# Patient Record
Sex: Female | Born: 1965
Health system: Southern US, Community
[De-identification: ages and names within clinical notes are randomized; demographics above are authoritative.]

## PROBLEM LIST (undated history)

## (undated) DIAGNOSIS — C50412 Malignant neoplasm of upper-outer quadrant of left female breast: Principal | ICD-10-CM

## (undated) DIAGNOSIS — B009 Herpesviral infection, unspecified: Secondary | ICD-10-CM

## (undated) DIAGNOSIS — K589 Irritable bowel syndrome without diarrhea: Secondary | ICD-10-CM

## (undated) DIAGNOSIS — Z1379 Encounter for other screening for genetic and chromosomal anomalies: Principal | ICD-10-CM

## (undated) DIAGNOSIS — I10 Essential (primary) hypertension: Secondary | ICD-10-CM

## (undated) HISTORY — DX: Encounter for other screening for genetic and chromosomal anomalies: Z13.79

## (undated) HISTORY — DX: Malignant neoplasm of upper-outer quadrant of left female breast: C50.412

## (undated) HISTORY — DX: Essential (primary) hypertension: I10

## (undated) HISTORY — DX: Herpesviral infection, unspecified: B00.9

## (undated) HISTORY — DX: Irritable bowel syndrome, unspecified: K58.9

## (undated) HISTORY — PX: MASTECTOMY: SHX3

---
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
## 1998-12-17 ENCOUNTER — Inpatient Hospital Stay (HOSPITAL_COMMUNITY): Admission: AD | Admit: 1998-12-17 | Discharge: 1998-12-20 | Payer: Self-pay | Admitting: Obstetrics & Gynecology

## 2000-07-28 ENCOUNTER — Other Ambulatory Visit: Admission: RE | Admit: 2000-07-28 | Discharge: 2000-07-28 | Payer: Self-pay | Admitting: Obstetrics & Gynecology

## 2002-01-02 ENCOUNTER — Other Ambulatory Visit: Admission: RE | Admit: 2002-01-02 | Discharge: 2002-01-02 | Payer: Self-pay | Admitting: Obstetrics & Gynecology

## 2003-03-19 ENCOUNTER — Other Ambulatory Visit: Admission: RE | Admit: 2003-03-19 | Discharge: 2003-03-19 | Payer: Self-pay | Admitting: Obstetrics & Gynecology

## 2004-05-26 ENCOUNTER — Other Ambulatory Visit: Admission: RE | Admit: 2004-05-26 | Discharge: 2004-05-26 | Payer: Self-pay | Admitting: Obstetrics & Gynecology

## 2015-04-21 HISTORY — PX: REDUCTION MAMMAPLASTY: SUR839

## 2015-04-21 HISTORY — PX: MASTECTOMY: SHX3

## 2015-04-21 HISTORY — PX: BREAST BIOPSY: SHX20

## 2015-12-09 ENCOUNTER — Other Ambulatory Visit: Payer: Self-pay | Admitting: Obstetrics & Gynecology

## 2015-12-09 DIAGNOSIS — R928 Other abnormal and inconclusive findings on diagnostic imaging of breast: Secondary | ICD-10-CM

## 2015-12-16 ENCOUNTER — Other Ambulatory Visit: Payer: Self-pay | Admitting: Obstetrics & Gynecology

## 2015-12-16 ENCOUNTER — Ambulatory Visit
Admission: RE | Admit: 2015-12-16 | Discharge: 2015-12-16 | Disposition: A | Discharge status: Home / Self Care | Payer: Managed Care, Other (non HMO) | Source: Ambulatory Visit | Attending: Obstetrics & Gynecology | Admitting: Obstetrics & Gynecology

## 2015-12-16 DIAGNOSIS — R928 Other abnormal and inconclusive findings on diagnostic imaging of breast: Secondary | ICD-10-CM

## 2015-12-20 ENCOUNTER — Ambulatory Visit
Admission: RE | Admit: 2015-12-20 | Discharge: 2015-12-20 | Disposition: A | Discharge status: Home / Self Care | Payer: Managed Care, Other (non HMO) | Source: Ambulatory Visit | Attending: Obstetrics & Gynecology | Admitting: Obstetrics & Gynecology

## 2015-12-20 ENCOUNTER — Other Ambulatory Visit: Payer: Self-pay | Admitting: Obstetrics & Gynecology

## 2015-12-20 DIAGNOSIS — R928 Other abnormal and inconclusive findings on diagnostic imaging of breast: Secondary | ICD-10-CM

## 2015-12-25 ENCOUNTER — Encounter: Payer: Self-pay | Admitting: *Deleted

## 2015-12-25 ENCOUNTER — Telehealth: Payer: Self-pay | Admitting: *Deleted

## 2015-12-25 DIAGNOSIS — C50412 Malignant neoplasm of upper-outer quadrant of left female breast: Secondary | ICD-10-CM

## 2015-12-25 DIAGNOSIS — Z170 Estrogen receptor positive status [ER+]: Secondary | ICD-10-CM | POA: Insufficient documentation

## 2015-12-25 HISTORY — DX: Malignant neoplasm of upper-outer quadrant of left female breast: C50.412

## 2015-12-25 NOTE — Telephone Encounter (Signed)
Confirmed [facility] for 01/01/16 at 1215 .  Instructions and contact information given.

## 2016-01-01 ENCOUNTER — Encounter: Payer: Self-pay | Admitting: Oncology

## 2016-01-01 ENCOUNTER — Encounter: Payer: Self-pay | Admitting: Physical Therapy

## 2016-01-01 ENCOUNTER — Encounter: Payer: Self-pay | Admitting: *Deleted

## 2016-01-01 ENCOUNTER — Ambulatory Visit: Payer: Managed Care, Other (non HMO) | Attending: General Surgery | Admitting: Physical Therapy

## 2016-01-01 ENCOUNTER — Other Ambulatory Visit: Payer: Self-pay | Admitting: General Surgery

## 2016-01-01 ENCOUNTER — Other Ambulatory Visit (HOSPITAL_BASED_OUTPATIENT_CLINIC_OR_DEPARTMENT_OTHER): Payer: Managed Care, Other (non HMO)

## 2016-01-01 ENCOUNTER — Ambulatory Visit (HOSPITAL_BASED_OUTPATIENT_CLINIC_OR_DEPARTMENT_OTHER): Payer: Managed Care, Other (non HMO) | Admitting: Oncology

## 2016-01-01 ENCOUNTER — Encounter: Payer: Self-pay | Admitting: General Practice

## 2016-01-01 ENCOUNTER — Ambulatory Visit
Admission: RE | Admit: 2016-01-01 | Discharge: 2016-01-01 | Disposition: A | Discharge status: Home / Self Care | Payer: Managed Care, Other (non HMO) | Source: Ambulatory Visit | Attending: Radiation Oncology | Admitting: Radiation Oncology

## 2016-01-01 VITALS — BP 142/73 | HR 87 | Temp 98.3°F | Resp 20 | Ht 63.5 in | Wt 163.5 lb

## 2016-01-01 DIAGNOSIS — R293 Abnormal posture: Secondary | ICD-10-CM | POA: Insufficient documentation

## 2016-01-01 DIAGNOSIS — C50412 Malignant neoplasm of upper-outer quadrant of left female breast: Secondary | ICD-10-CM

## 2016-01-01 DIAGNOSIS — D0512 Intraductal carcinoma in situ of left breast: Secondary | ICD-10-CM

## 2016-01-01 DIAGNOSIS — Z170 Estrogen receptor positive status [ER+]: Secondary | ICD-10-CM | POA: Diagnosis not present

## 2016-01-01 DIAGNOSIS — C50812 Malignant neoplasm of overlapping sites of left female breast: Secondary | ICD-10-CM | POA: Diagnosis not present

## 2016-01-01 LAB — COMPREHENSIVE METABOLIC PANEL
ALT: 32 U/L (ref 0–55)
ANION GAP: 12 meq/L — AB (ref 3–11)
AST: 19 U/L (ref 5–34)
Albumin: 4 g/dL (ref 3.5–5.0)
Alkaline Phosphatase: 22 U/L — ABNORMAL LOW (ref 40–150)
BILIRUBIN TOTAL: 0.39 mg/dL (ref 0.20–1.20)
BUN: 18.1 mg/dL (ref 7.0–26.0)
CHLORIDE: 101 meq/L (ref 98–109)
CO2: 25 meq/L (ref 22–29)
Calcium: 9.5 mg/dL (ref 8.4–10.4)
Creatinine: 0.8 mg/dL (ref 0.6–1.1)
EGFR: 88 mL/min/{1.73_m2} — AB (ref >90)
GLUCOSE: 120 mg/dL (ref 70–140)
Potassium: 3.6 mEq/L (ref 3.5–5.1)
SODIUM: 139 meq/L (ref 136–145)
TOTAL PROTEIN: 7.6 g/dL (ref 6.4–8.3)

## 2016-01-01 LAB — CBC WITH DIFFERENTIAL/PLATELET
BASO%: 0.5 % (ref 0.0–2.0)
Basophils Absolute: 0 10*3/uL (ref 0.0–0.1)
EOS%: 1.9 % (ref 0.0–7.0)
Eosinophils Absolute: 0.1 10*3/uL (ref 0.0–0.5)
HCT: 44.6 % (ref 34.8–46.6)
HGB: 14.8 g/dL (ref 11.6–15.9)
LYMPH%: 35.5 % (ref 14.0–49.7)
MCH: 28.4 pg (ref 25.1–34.0)
MCHC: 33.2 g/dL (ref 31.5–36.0)
MCV: 85.6 fL (ref 79.5–101.0)
MONO#: 0.3 10*3/uL (ref 0.1–0.9)
MONO%: 5.4 % (ref 0.0–14.0)
NEUT%: 56.7 % (ref 38.4–76.8)
NEUTROS ABS: 3.3 10*3/uL (ref 1.5–6.5)
Platelets: 258 10*3/uL (ref 145–400)
RBC: 5.21 10*6/uL (ref 3.70–5.45)
RDW: 13.6 % (ref 11.2–14.5)
WBC: 5.9 10*3/uL (ref 3.9–10.3)
lymph#: 2.1 10*3/uL (ref 0.9–3.3)

## 2016-01-01 MED ORDER — TAMOXIFEN CITRATE 20 MG PO TABS: 20.0000 mg | ORAL_TABLET | Freq: Every day | ORAL | 12 refills | Status: DC

## 2016-01-01 NOTE — Progress Notes (Addendum)
Radiation Oncology         [phone] ________________________________  Name: Tammy Gray MRN: [id number]  Date: 01/01/2016  DOB: Dec 23, 1965  [id number] Tammy Mages, MD  Tammy Rea, MD     REFERRING PHYSICIAN: Vania Rea, MD   DIAGNOSIS: The encounter diagnosis was Breast cancer of upper-outer quadrant of left female breast ([place]).   HISTORY OF PRESENT ILLNESS: Tammy Gray is a 50 y.o. female seen in the multidisciplinary breast cancer clinic for a new left sided breast cancer. She was found to have calcifications on a screening mammogram in the left breast. Ultrasound revealed these calcifications spanned about 4.7 cm. No evidence of adenopathy was noted. She comes today for evaluation and recommendations of care for her cancer.  PREVIOUS RADIATION THERAPY: No   PAST MEDICAL HISTORY:  Past Medical History:  Diagnosis Date  . Breast cancer of upper-outer quadrant of left female breast ([place]) 12/25/2015  . Herpes 1987  . Hypertension   . IBS (irritable bowel syndrome)        PAST SURGICAL HISTORY: Past Surgical History:  Procedure Laterality Date  . CESAREAN SECTION       FAMILY HISTORY:  Family History  Problem Relation Age of Onset  . Prostate cancer Father   . Colon cancer Paternal Grandmother   . Colon cancer Paternal Grandfather      SOCIAL HISTORY:  reports that she has never smoked. She has never used smokeless tobacco. She reports that she does not drink alcohol or use drugs. The patient is divorced and lives in [name].    ALLERGIES: Review of patient's allergies indicates no known allergies.   MEDICATIONS:  No current outpatient prescriptions on file.   No current facility-administered medications for this encounter.      REVIEW OF SYSTEMS: On review of systems, the patient reports that she is doing well overall. She denies any chest pain, shortness of breath, cough, fevers, chills, night sweats, unintended weight changes. She denies any bowel  or bladder disturbances, and denies abdominal pain, nausea or vomiting. She denies any new musculoskeletal or joint aches or pains. A complete review of systems is obtained and is otherwise negative.     PHYSICAL EXAM:  Wt Readings from Last 3 Encounters:  01/01/16 163 lb 8 oz (74.2 kg)   Temp Readings from Last 3 Encounters:  01/01/16 98.3 F (36.8 C) (Oral)   BP Readings from Last 3 Encounters:  01/01/16 (!) 142/73   Pulse Readings from Last 3 Encounters:  01/01/16 87   Pain scale 0/10 In general this is a well appearing caucasian female in no acute distress. She's alert and oriented x4 and appropriate throughout the examination. Cardiopulmonary assessment is negative for acute distress and she exhibits normal effort. Breast exam is deferred.   ECOG = 0  0 - Asymptomatic (Fully active, able to carry on all predisease activities without restriction)  1 - Symptomatic but completely ambulatory (Restricted in physically strenuous activity but ambulatory and able to carry out work of a [name] sedentary nature. For example, [name] housework, office work)  2 - Symptomatic, <50% in bed during the day (Ambulatory and capable of all self care but unable to carry out any work activities. Up and about more than 50% of waking hours)  3 - Symptomatic, >50% in bed, but not bedbound (Capable of only limited self-care, confined to bed or chair 50% or more of waking hours)  4 - Bedbound (Completely disabled. Cannot carry on any self-care. Totally confined  to bed or chair)  5 - Death   Eustace Pen MM, Creech RH, Tormey DC, et al. [phone]). "Toxicity and response criteria of the [facility]". [place] Oncol. 5 (6): 649-55    LABORATORY DATA:  Lab Results  Component Value Date   WBC 5.9 01/01/2016   HGB 14.8 01/01/2016   HCT 44.6 01/01/2016   MCV 85.6 01/01/2016   PLT 258 01/01/2016   Lab Results  Component Value Date   NA 139 01/01/2016   K 3.6 01/01/2016   CO2  25 01/01/2016   Lab Results  Component Value Date   ALT 32 01/01/2016   AST 19 01/01/2016   ALKPHOS 22 (L) 01/01/2016   BILITOT 0.39 01/01/2016      RADIOGRAPHY: Mm Digital Diagnostic Unilat L  Result Date: 12/20/2015 CLINICAL DATA:  Post biopsy mammogram of the left breast for clip placement. EXAM: DIAGNOSTIC LEFT MAMMOGRAM POST STEREOTACTIC BIOPSY COMPARISON:  Previous exam(s). FINDINGS: Mammographic images were obtained following stereotactic guided biopsy of calcifications in the upper-outer quadrant and the lower outer quadrant of the left breast. The X shaped biopsy marking clip is appropriately positioned at the site of biopsy in the upper-outer left breast. The coil shaped biopsy marking clip is appropriately positioned at the site of biopsied calcifications in the lower outer left breast. IMPRESSION: 1. Appropriate positioning of the X shaped biopsy marking clip at the site of biopsy in the upper-outer left breast. 2. Appropriate positioning of the coil shaped biopsy marking clip at the site of biopsy in the lower outer left breast. Final Assessment: Post Procedure Mammograms for Marker Placement Electronically Signed   By: Ammie Ferrier M.D.   On: 12/20/2015 10:43   Mm Digital Diagnostic Unilat L  Result Date: 12/16/2015 CLINICAL DATA:  Patient was called back from screening mammogram for left breast calcifications. EXAM: DIGITAL DIAGNOSTIC LEFT MAMMOGRAM WITH CAD COMPARISON:  Previous exam(s). ACR Breast Density Category c: The breast tissue is heterogeneously dense, which may obscure small masses. FINDINGS: Magnification views of the left breast were performed. There is 3 mm of developing coarse heterogeneous calcifications in the lateral aspect of the left breast. 4 cm lateral to this are developing loosely grouped punctate calcifications spanning an area 4.7 cm. There is no suspicious mass. Mammographic images were processed with CAD. IMPRESSION: Indeterminate calcifications in the  left breast. RECOMMENDATION: Stereotactic biopsy of the grouped coarse heterogeneous calcifications as well as the loosely grouped punctate calcifications is recommended. Biopsies will be scheduled at the patient's convenience. I have discussed the findings and recommendations with the patient. Results were also provided in writing at the conclusion of the visit. If applicable, a reminder letter will be sent to the patient regarding the next appointment. BI-RADS CATEGORY  4: Suspicious abnormality - biopsy should be considered. Electronically Signed   By: Lillia Mountain M.D.   On: 12/16/2015 11:10   Mm Lt Breast Bx W Loc Dev 1st Lesion Image Bx Spec Stereo Guide  Addendum Date: 12/24/2015   ADDENDUM REPORT: 12/24/2015 13:18 ADDENDUM: Pathology revealed HIGH GRADE DUCTAL CARCINOMA IN SITU WITH CALCIFICATIONS of the upper outer quadrant of the Left breast. HIGH GRADE DUCTAL CARCINOMA IN SITU WITH CALCIFICATIONS AND NECROSIS of the lower outer quadrant of the Left breast. This was found to be concordant by Dr. Ammie Ferrier. Pathology results were discussed with the patient by telephone. The patient reported doing well after the biopsies with tenderness and bruising at the sites. Post biopsy instructions and care were  reviewed and questions were answered. The patient was encouraged to call [facility] for any additional concerns. The patient was referred to [facility] on January 01, 2016. Pathology results reported by Terie Purser, RN on 12/24/2015. Electronically Signed   By: Ammie Ferrier M.D.   On: 12/24/2015 13:18   Result Date: 12/24/2015 CLINICAL DATA:  50 year old female presenting for stereotactic biopsy of left breast calcifications at 2 sites. EXAM: LEFT BREAST STEREOTACTIC CORE NEEDLE BIOPSY COMPARISON:  Previous exams. FINDINGS: The patient and I discussed the procedure of stereotactic-guided  biopsy including benefits and alternatives. We discussed the high likelihood of a successful procedure. We discussed the risks of the procedure including infection, bleeding, tissue injury, clip migration, and inadequate sampling. Informed written consent was given. The usual time out protocol was performed immediately prior to the procedure. Using sterile technique and 1% Lidocaine as local anesthetic, under stereotactic guidance, a 9 gauge vacuum assisted device was used to perform core needle biopsy of calcifications in the upper-outer quadrant of the left breast using a lateral approach. Specimen radiograph was performed showing calcifications in at least 1 of the core samples. Specimens with calcifications are identified for pathology. At the end of this biopsy, an X shaped tissue marker clip was deployed into the biopsy cavity. Using sterile technique and 1% Lidocaine as local anesthetic, under stereotactic guidance, a 9 gauge vacuum assisted device was used to perform core needle biopsy of calcifications in the lower outer quadrant of the left breast using a lateral approach. Specimen radiograph was performed showing calcifications in the several core samples. Specimens with calcifications are identified for pathology. At the conclusion of the procedure, a coil shaped tissue marker clip was deployed into the biopsy cavity. Follow-up 2-view mammogram was performed and dictated separately. IMPRESSION: 1. Stereotactic-guided biopsy of calcifications in the upper-outer quadrant of the left breast. No apparent complications. 2. Stereotactic guided biopsy of calcifications in the lower outer quadrant of the left breast. No apparent complications. Electronically Signed: By: Ammie Ferrier M.D. On: 12/20/2015 10:35   Mm Lt Breast Bx W Loc Dev Ea Ad Lesion Img Bx Spec Stereo Guide  Addendum Date: 12/24/2015   ADDENDUM REPORT: 12/24/2015 13:18 ADDENDUM: Pathology revealed HIGH GRADE DUCTAL CARCINOMA IN SITU WITH  CALCIFICATIONS of the upper outer quadrant of the Left breast. HIGH GRADE DUCTAL CARCINOMA IN SITU WITH CALCIFICATIONS AND NECROSIS of the lower outer quadrant of the Left breast. This was found to be concordant by Dr. Ammie Ferrier. Pathology results were discussed with the patient by telephone. The patient reported doing well after the biopsies with tenderness and bruising at the sites. Post biopsy instructions and care were reviewed and questions were answered. The patient was encouraged to call [facility] for any additional concerns. The patient was referred to [facility] on January 01, 2016. Pathology results reported by Terie Purser, RN on 12/24/2015. Electronically Signed   By: Ammie Ferrier M.D.   On: 12/24/2015 13:18   Result Date: 12/24/2015 CLINICAL DATA:  50 year old female presenting for stereotactic biopsy of left breast calcifications at 2 sites. EXAM: LEFT BREAST STEREOTACTIC CORE NEEDLE BIOPSY COMPARISON:  Previous exams. FINDINGS: The patient and I discussed the procedure of stereotactic-guided biopsy including benefits and alternatives. We discussed the high likelihood of a successful procedure. We discussed the risks of the procedure including infection,  bleeding, tissue injury, clip migration, and inadequate sampling. Informed written consent was given. The usual time out protocol was performed immediately prior to the procedure. Using sterile technique and 1% Lidocaine as local anesthetic, under stereotactic guidance, a 9 gauge vacuum assisted device was used to perform core needle biopsy of calcifications in the upper-outer quadrant of the left breast using a lateral approach. Specimen radiograph was performed showing calcifications in at least 1 of the core samples. Specimens with calcifications are identified for pathology. At the end of this biopsy, an X shaped tissue marker  clip was deployed into the biopsy cavity. Using sterile technique and 1% Lidocaine as local anesthetic, under stereotactic guidance, a 9 gauge vacuum assisted device was used to perform core needle biopsy of calcifications in the lower outer quadrant of the left breast using a lateral approach. Specimen radiograph was performed showing calcifications in the several core samples. Specimens with calcifications are identified for pathology. At the conclusion of the procedure, a coil shaped tissue marker clip was deployed into the biopsy cavity. Follow-up 2-view mammogram was performed and dictated separately. IMPRESSION: 1. Stereotactic-guided biopsy of calcifications in the upper-outer quadrant of the left breast. No apparent complications. 2. Stereotactic guided biopsy of calcifications in the lower outer quadrant of the left breast. No apparent complications. Electronically Signed: By: Ammie Ferrier M.D. On: 12/20/2015 10:35       IMPRESSION/PLAN: 1. ER/PR positive high grade DCIS with necrosis of the left breast. Dr. Lisbeth Renshaw discusses the pathology findings and reviews the nature of in situ disease. The consensus from the breast conference include conservation surgery versus mastectomy with sentinel mapping. Surgery would be followed by antiestrogen therapy. Based on  The patient's interest, she will move forward with left mastectomy and sentinel node evaluation. Given this, there is a low likelihood that she would need radiotherapy for her cancer. Dr. Lisbeth Renshaw discusses the risks, benefits, short, and long term effects of treatment, and at the end of the conversation all her questions are answered. Following surgery, she will proceed with estrogen blockade with Dr. Jana Hakim. We would be happy to see her back in the future as needed.   The above documentation reflects my direct findings during this shared patient visit. Please see the separate note by Dr. Lisbeth Renshaw on this date for the remainder of the patient's  plan of care.    Carola Rhine, PAC

## 2016-01-01 NOTE — Progress Notes (Signed)
[facility]  [phone] [phone]     ID: Tammy Gray DOB: 1965/12/27  MR#: [id number]  [id number]  Patient Care Team: Vania Rea, MD as PCP - General (Obstetrics and Gynecology) Stark Klein, MD as Consulting Physician (General Surgery) Chauncey Cruel, MD as Consulting Physician (Oncology) Kyung Rudd, MD as Consulting Physician (Radiation Oncology) Danella Sensing, MD as Consulting Physician (Dermatology) OTHER MD:  CHIEF COMPLAINT: Ductal carcinoma in situ  CURRENT TREATMENT: Awaiting definitive surgery   BREAST CANCER HISTORY: Tammy Gray had routine screening mammography at [facility] 12/05/2015. There were calcifications in the left breast which were felt to warrant further evaluation. Accordingly on 12/16/2015 she underwent left diagnostic mammography at the [facility]. This showed the breast density to be category C. In the lateral aspect of the left breast there was an area of 3 mm of course calcifications and 4 cm lateral to this there is an area of developing punctate calcifications measuring 4.7 cm.  Biopsy of these 2 areas was performed 12/20/2015. Both showed high-grade ductal carcinoma in situ, both were estrogen receptor positive (80-90%) and progesterone receptor positive (5-90%), all these being with moderate staining intensity.  Her subsequent history is as detailed below  INTERVAL HISTORY: Tammy Gray was evaluated in the multidisciplinary breast cancer clinic 01/01/2016. Her case was also presented in the multidisciplinary breast cancer conference that same morning. At that time a preliminary plan was proposed: Likely mastectomy, with sentinel lymph node sampling to be considered, with adjuvant radiation is appropriate, and consideration of anti-estrogens at the completion of local  REVIEW OF SYSTEMS: There were no specific symptoms leading to the original mammogram, which was routinely scheduled. The patient denies  unusual headaches, visual changes, nausea, vomiting, stiff neck, dizziness, or gait imbalance. There has been no cough, phlegm production, or pleurisy, no chest pain or pressure, and no change in bowel or bladder habits. The patient denies fever, rash, bleeding, unexplained fatigue or unexplained weight loss. She exercises chiefly by walking and she walks between 45 minutes in our at least 5 days a week. A detailed review of systems was otherwise entirely negative.   PAST MEDICAL HISTORY: Past Medical History:  Diagnosis Date  . Breast cancer of upper-outer quadrant of left female breast ([place]) 12/25/2015  . Herpes 1987  . Hypertension   . IBS (irritable bowel syndrome)     PAST SURGICAL HISTORY: Past Surgical History:  Procedure Laterality Date  . CESAREAN SECTION      FAMILY HISTORY Family History  Problem Relation Age of Onset  . Prostate cancer Father   . Colon cancer Paternal Grandmother   . Colon cancer Paternal Grandfather   The patient's father is 31 as of September 2017. He has a history of prostate cancer diagnosed age 95. The patient's mother is also alive, also 36 as of September 2017. On the paternal side a grandfather had colon cancer and a grandmother also colon cancer. There is no history of breast or ovarian cancer in the family to the patient's knowledge.  GYNECOLOGIC HISTORY:  No LMP recorded. She still having regular periods, and was on oral contraceptives until the time of this diagnosis, when this was stopped.  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 She is divorced. At home are her 2 sons Tammy Gray, age 64, studying business at [employer], and Tammy Gray, 17, currently in high school    ADVANCED DIRECTIVES: Not in place. At the 01/01/2016 visit the patient was given the appropriate  documents to complete and notarize at her discretion    HEALTH MAINTENANCE: Social History  Substance Use Topics  . Smoking status: Never Smoker  .  Smokeless tobacco: Never Used  . Alcohol use No     Colonoscopy:Never  PAP: Up-to-date  Bone density: Never   No Known Allergies  No current outpatient prescriptions on file.   No current facility-administered medications for this visit.     OBJECTIVE: Middle-aged white woman who appears stated age 50:   01/01/16 1228  BP: (!) 142/73  Pulse: 87  Resp: 20  Temp: 98.3 F (36.8 C)     Body mass index is 28.51 kg/m.    ECOG FS:0 - Asymptomatic  Ocular: Sclerae unicteric, pupils equal, round and reactive to Tammy Gray: Oropharynx clear and moist Lymphatic: No cervical or supraclavicular adenopathy Lungs no rales or rhonchi, good excursion bilaterally Heart regular rate and rhythm, no murmur appreciated Abd soft, nontender, positive bowel sounds MSK no focal spinal tenderness, no joint edema Neuro: non-focal, well-oriented, appropriate affect Breasts: The right breast is unremarkable. The left breast is status post recent biopsy. There is no suspicious mass. There are no skin or nipple changes of concern. The left axilla is benign.   LAB RESULTS:  CMP     Component Value Date/Time   NA 139 01/01/2016 1213   K 3.6 01/01/2016 1213   CO2 25 01/01/2016 1213   GLUCOSE 120 01/01/2016 1213   BUN 18.1 01/01/2016 1213   CREATININE 0.8 01/01/2016 1213   CALCIUM 9.5 01/01/2016 1213   PROT 7.6 01/01/2016 1213   ALBUMIN 4.0 01/01/2016 1213   AST 19 01/01/2016 1213   ALT 32 01/01/2016 1213   ALKPHOS 22 (L) 01/01/2016 1213   BILITOT 0.39 01/01/2016 1213    INo results found for: SPEP, UPEP  Lab Results  Component Value Date   WBC 5.9 01/01/2016   NEUTROABS 3.3 01/01/2016   HGB 14.8 01/01/2016   HCT 44.6 01/01/2016   MCV 85.6 01/01/2016   PLT 258 01/01/2016      Chemistry      Component Value Date/Time   NA 139 01/01/2016 1213   K 3.6 01/01/2016 1213   CO2 25 01/01/2016 1213   BUN 18.1 01/01/2016 1213   CREATININE 0.8 01/01/2016 1213        Component Value Date/Time   CALCIUM 9.5 01/01/2016 1213   ALKPHOS 22 (L) 01/01/2016 1213   AST 19 01/01/2016 1213   ALT 32 01/01/2016 1213   BILITOT 0.39 01/01/2016 1213       No results found for: LABCA2  No components found for: LABCA125  No results for input(s): INR in the last 168 hours.  Urinalysis No results found for: COLORURINE, APPEARANCEUR, LABSPEC, PHURINE, GLUCOSEU, HGBUR, BILIRUBINUR, KETONESUR, PROTEINUR, UROBILINOGEN, NITRITE, LEUKOCYTESUR   STUDIES: Mm Digital Diagnostic Unilat L  Result Date: 12/20/2015 CLINICAL DATA:  Post biopsy mammogram of the left breast for clip placement. EXAM: DIAGNOSTIC LEFT MAMMOGRAM POST STEREOTACTIC BIOPSY COMPARISON:  Previous exam(s). FINDINGS: Mammographic images were obtained following stereotactic guided biopsy of calcifications in the upper-outer quadrant and the lower outer quadrant of the left breast. The X shaped biopsy marking clip is appropriately positioned at the site of biopsy in the upper-outer left breast. The coil shaped biopsy marking clip is appropriately positioned at the site of biopsied calcifications in the lower outer left breast. IMPRESSION: 1. Appropriate positioning of the X shaped biopsy marking clip at the site of biopsy in the upper-outer left breast.  2. Appropriate positioning of the coil shaped biopsy marking clip at the site of biopsy in the lower outer left breast. Final Assessment: Post Procedure Mammograms for Marker Placement Electronically Signed   By: Ammie Ferrier M.D.   On: 12/20/2015 10:43   Mm Digital Diagnostic Unilat L  Result Date: 12/16/2015 CLINICAL DATA:  Patient was called back from screening mammogram for left breast calcifications. EXAM: DIGITAL DIAGNOSTIC LEFT MAMMOGRAM WITH CAD COMPARISON:  Previous exam(s). ACR Breast Density Category c: The breast tissue is heterogeneously dense, which may obscure small masses. FINDINGS: Magnification views of the left breast were performed. There is  3 mm of developing coarse heterogeneous calcifications in the lateral aspect of the left breast. 4 cm lateral to this are developing loosely grouped punctate calcifications spanning an area 4.7 cm. There is no suspicious mass. Mammographic images were processed with CAD. IMPRESSION: Indeterminate calcifications in the left breast. RECOMMENDATION: Stereotactic biopsy of the grouped coarse heterogeneous calcifications as well as the loosely grouped punctate calcifications is recommended. Biopsies will be scheduled at the patient's convenience. I have discussed the findings and recommendations with the patient. Results were also provided in writing at the conclusion of the visit. If applicable, a reminder letter will be sent to the patient regarding the next appointment. BI-RADS CATEGORY  4: Suspicious abnormality - biopsy should be considered. Electronically Signed   By: Lillia Mountain M.D.   On: 12/16/2015 11:10   Mm Lt Breast Bx W Loc Dev 1st Lesion Image Bx Spec Stereo Guide  Addendum Date: 12/24/2015   ADDENDUM REPORT: 12/24/2015 13:18 ADDENDUM: Pathology revealed HIGH GRADE DUCTAL CARCINOMA IN SITU WITH CALCIFICATIONS of the upper outer quadrant of the Left breast. HIGH GRADE DUCTAL CARCINOMA IN SITU WITH CALCIFICATIONS AND NECROSIS of the lower outer quadrant of the Left breast. This was found to be concordant by Dr. Ammie Ferrier. Pathology results were discussed with the patient by telephone. The patient reported doing well after the biopsies with tenderness and bruising at the sites. Post biopsy instructions and care were reviewed and questions were answered. The patient was encouraged to call [facility] for any additional concerns. The patient was referred to [facility] at [facility] on January 01, 2016. Pathology results reported by Terie Purser, RN on 12/24/2015. Electronically Signed   By: Ammie Ferrier M.D.    On: 12/24/2015 13:18   Result Date: 12/24/2015 CLINICAL DATA:  50 year old female presenting for stereotactic biopsy of left breast calcifications at 2 sites. EXAM: LEFT BREAST STEREOTACTIC CORE NEEDLE BIOPSY COMPARISON:  Previous exams. FINDINGS: The patient and I discussed the procedure of stereotactic-guided biopsy including benefits and alternatives. We discussed the high likelihood of a successful procedure. We discussed the risks of the procedure including infection, bleeding, tissue injury, clip migration, and inadequate sampling. Informed written consent was given. The usual time out protocol was performed immediately prior to the procedure. Using sterile technique and 1% Lidocaine as local anesthetic, under stereotactic guidance, a 9 gauge vacuum assisted device was used to perform core needle biopsy of calcifications in the upper-outer quadrant of the left breast using a lateral approach. Specimen radiograph was performed showing calcifications in at least 1 of the core samples. Specimens with calcifications are identified for pathology. At the end of this biopsy, an X shaped tissue marker clip was deployed into the biopsy cavity. Using sterile technique and 1% Lidocaine as local anesthetic, under stereotactic guidance, a 9 gauge vacuum  assisted device was used to perform core needle biopsy of calcifications in the lower outer quadrant of the left breast using a lateral approach. Specimen radiograph was performed showing calcifications in the several core samples. Specimens with calcifications are identified for pathology. At the conclusion of the procedure, a coil shaped tissue marker clip was deployed into the biopsy cavity. Follow-up 2-view mammogram was performed and dictated separately. IMPRESSION: 1. Stereotactic-guided biopsy of calcifications in the upper-outer quadrant of the left breast. No apparent complications. 2. Stereotactic guided biopsy of calcifications in the lower outer quadrant of  the left breast. No apparent complications. Electronically Signed: By: Ammie Ferrier M.D. On: 12/20/2015 10:35   Mm Lt Breast Bx W Loc Dev Ea Ad Lesion Img Bx Spec Stereo Guide  Addendum Date: 12/24/2015   ADDENDUM REPORT: 12/24/2015 13:18 ADDENDUM: Pathology revealed HIGH GRADE DUCTAL CARCINOMA IN SITU WITH CALCIFICATIONS of the upper outer quadrant of the Left breast. HIGH GRADE DUCTAL CARCINOMA IN SITU WITH CALCIFICATIONS AND NECROSIS of the lower outer quadrant of the Left breast. This was found to be concordant by Dr. Ammie Ferrier. Pathology results were discussed with the patient by telephone. The patient reported doing well after the biopsies with tenderness and bruising at the sites. Post biopsy instructions and care were reviewed and questions were answered. The patient was encouraged to call [facility] for any additional concerns. The patient was referred to [facility] at [facility] on January 01, 2016. Pathology results reported by Terie Purser, RN on 12/24/2015. Electronically Signed   By: Ammie Ferrier M.D.   On: 12/24/2015 13:18   Result Date: 12/24/2015 CLINICAL DATA:  50 year old female presenting for stereotactic biopsy of left breast calcifications at 2 sites. EXAM: LEFT BREAST STEREOTACTIC CORE NEEDLE BIOPSY COMPARISON:  Previous exams. FINDINGS: The patient and I discussed the procedure of stereotactic-guided biopsy including benefits and alternatives. We discussed the high likelihood of a successful procedure. We discussed the risks of the procedure including infection, bleeding, tissue injury, clip migration, and inadequate sampling. Informed written consent was given. The usual time out protocol was performed immediately prior to the procedure. Using sterile technique and 1% Lidocaine as local anesthetic, under stereotactic guidance, a 9 gauge vacuum assisted device was used to perform  core needle biopsy of calcifications in the upper-outer quadrant of the left breast using a lateral approach. Specimen radiograph was performed showing calcifications in at least 1 of the core samples. Specimens with calcifications are identified for pathology. At the end of this biopsy, an X shaped tissue marker clip was deployed into the biopsy cavity. Using sterile technique and 1% Lidocaine as local anesthetic, under stereotactic guidance, a 9 gauge vacuum assisted device was used to perform core needle biopsy of calcifications in the lower outer quadrant of the left breast using a lateral approach. Specimen radiograph was performed showing calcifications in the several core samples. Specimens with calcifications are identified for pathology. At the conclusion of the procedure, a coil shaped tissue marker clip was deployed into the biopsy cavity. Follow-up 2-view mammogram was performed and dictated separately. IMPRESSION: 1. Stereotactic-guided biopsy of calcifications in the upper-outer quadrant of the left breast. No apparent complications. 2. Stereotactic guided biopsy of calcifications in the lower outer quadrant of the left breast. No apparent complications. Electronically Signed: By: Ammie Ferrier M.D. On: 12/20/2015 10:35    ELIGIBLE FOR AVAILABLE RESEARCH PROTOCOL: no  ASSESSMENT: 50 y.o. [facility] woman,  status post left breast biopsy 12/20/2015 for ductal carcinoma in situ, grade 3, estrogen and progesterone receptor positive.  (1) genetics testing pending  (2) definitive surgery pending  (3) adjuvant radiation to follow as appropriate  (4) tamoxifen started 01/01/2016   PLAN: We spent the better part of today's hour-long appointment discussing the biology of breast cancer in general, and the specifics of the patient's tumor in particular. Zhoey understands that in noninvasive ductal carcinoma, also called ductal carcinoma in situ ("DCIS") the breast cancer cells  remain trapped in the ducts were they started. They cannot travel to a vital organ. For that reason these cancers in themselves are not life-threatening.  If the whole breast is removed then all the ducts are removed and since the cancer cells are trapped in the ducts, the cure rate with mastectomy for noninvasive breast cancer is approximately 99%. Nevertheless we recommend lumpectomy, because there is no survival advantage to mastectomy and because the cosmetic result is generally superior with breast conservation.  If the patient is keeping her breasts, there will be some risk of recurrence. The recurrence can only be in the same breast since, again, the cells are trapped in the ducts. There is no connection from one breast to the other. The risk of local recurrence is cut by more than half with radiation, which is standard in this situation.  In estrogen receptor positive cancers like Neya's, anti-estrogens can also be considered. They will further reduce the risk of recurrence . In addition anti-estrogens will lower the risk of a new breast cancer developing in either breast,  by one half. That risk approaches 1% per year. Anti-estrogens reduce it to 1/2%.  Since Zenja is considering mastectomy and reconstruction, and the planning for this may be protracted, without it might be useful for her to be started on tamoxifen now. This means she is already being systemically treated, and she can take as long as she would like or need to make the best plans for her without any feeling of urgency.  It will also allow her to see how she does with tamoxifen. She tolerates it well and the plan will be to continue this for 5-10 years. If she does not tolerated well we will have to consider alternatives when she returns to see.  Joyann has a good understanding of the overall plan. She agrees with it. She knows the goal of treatment in her case is cure. She will call with any problems that may develop before her  next visit here.  Chauncey Cruel, MD   01/01/2016 1:51 PM Medical Oncology and Hematology [facility] [address] [place], [place] [address] Tel. [phone]    Fax. [phone]

## 2016-01-01 NOTE — Therapy (Signed)
[place], [place], [address] Phone: [phone]   Fax:  [phone]  Physical Therapy Evaluation  Patient Details  Name: Tammy Gray MRN: [id number] Date of Birth: March 08, 1966 Referring Provider: Dr. Stark Klein  Encounter Date: 01/01/2016      PT End of Session - 01/01/16 1505    Visit Number 1   Number of Visits 1   PT Start Time 1420   PT Stop Time 1445   PT Time Calculation (min) 25 min   Activity Tolerance Patient tolerated treatment well   Behavior During Therapy [facility] for tasks assessed/performed      Past Medical History:  Diagnosis Date  . Breast cancer of upper-outer quadrant of left female breast ([place]) 12/25/2015  . Herpes 1987  . Hypertension   . IBS (irritable bowel syndrome)     Past Surgical History:  Procedure Laterality Date  . CESAREAN SECTION      There were no vitals filed for this visit.       Subjective Assessment - 01/01/16 1458    Subjective Patient reports she is here today to meet with her medical team for her newly diagnosed left breast cancer.   Pertinent History Patient was diagnosed on 12/03/15 with left high grade DCIS breast cancer.  It is located in the lower outer and upper outer quadrants and measure 4.7 cm, and is ER/PR positive.   Patient Stated Goals Reduce lymphedema risk and learn post op shoulder ROM HEP   Currently in Pain? No/denies   Multiple Pain Sites No            [facility] PT Assessment - 01/01/16 0001      Assessment   Medical Diagnosis Left breast cancer   Referring Provider Dr. Stark Klein   Onset Date/Surgical Date 12/03/15   Hand Dominance Right   Prior Therapy none     Precautions   Precautions Other (comment)   Precaution Comments active cancer     Restrictions   Weight Bearing Restrictions No     Balance Screen   Has the patient fallen in the past 6 months No   Has the patient had a decrease in activity level because  of a fear of falling?  No   Is the patient reluctant to leave their home because of a fear of falling?  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   Leisure She walks 45 minutes 5x/week     Cognition   Overall Cognitive Status Within Functional Limits for tasks assessed     Posture/Postural Control   Posture/Postural Control Postural limitations   Postural Limitations Rounded Shoulders;Forward head     ROM / Strength   AROM / PROM / Strength AROM;Strength     AROM   AROM Assessment Site Shoulder;Cervical   Right/Left Shoulder Right;Left   Right Shoulder Extension 58 Degrees   Right Shoulder Flexion 154 Degrees   Right Shoulder ABduction 158 Degrees   Right Shoulder Internal Rotation 77 Degrees   Right Shoulder External Rotation 76 Degrees   Left Shoulder Extension 62 Degrees   Left Shoulder Flexion 148 Degrees   Left Shoulder ABduction 172 Degrees   Left Shoulder Internal Rotation 70  Degrees   Left Shoulder External Rotation 80 Degrees   Cervical Flexion WNL   Cervical Extension WNL   Cervical - Right Side Bend WNL   Cervical - Left Side Bend WNL   Cervical - Right Rotation WNL   Cervical - Left Rotation WNL     Strength   Overall Strength Within functional limits for tasks performed           LYMPHEDEMA/ONCOLOGY QUESTIONNAIRE - 01/01/16 1503      Type   Cancer Type Left breast cancer     Lymphedema Assessments   Lymphedema Assessments Upper extremities     Right Upper Extremity Lymphedema   10 cm Proximal to Olecranon Process 29.4 cm   Olecranon Process 26.4 cm   10 cm Proximal to Ulnar Styloid Process 22.4 cm   Just Proximal to Ulnar Styloid Process 15.3 cm   Across Hand at [employer] 18.8 cm   At [place] of  2nd Digit 6.3 cm     Left Upper Extremity Lymphedema   10 cm Proximal to Olecranon Process 29.7 cm   Olecranon Process 26.2 cm   15 cm Proximal to Ulnar Styloid Process 22.2 cm   10 cm Proximal to Ulnar Styloid Process 15.2 cm   Just Proximal to Ulnar Styloid Process 17.8 cm   Across Hand at [employer] 6 cm      Patient was instructed today in a home exercise program today for post op shoulder range of motion. These included active assist shoulder flexion in sitting, scapular retraction, wall walking with shoulder abduction, and hands behind head external rotation.  She was encouraged to do these twice a day, holding 3 seconds and repeating 5 times when permitted by her physician.         PT Education - 01/01/16 1504    Education provided Yes   Education Details Lymphedema risk reduction and post op shoulder ROM HEP   Person(s) Educated Patient   Methods Explanation;Demonstration;Handout   Comprehension Returned demonstration;Verbalized understanding              Breast Clinic Goals - 01/01/16 1542      Patient will be able to verbalize understanding of pertinent lymphedema risk reduction practices relevant to her diagnosis specifically related to skin care.   Time 1   Period Days   Status Achieved     Patient will be able to return demonstrate and/or verbalize understanding of the post-op home exercise program related to regaining shoulder range of motion.   Time 1   Period Days   Status Achieved     Patient will be able to verbalize understanding of the importance of attending the postoperative After Breast Cancer Class for further lymphedema risk reduction education and therapeutic exercise.   Time 1   Period Days   Status Achieved              Plan - 01/01/16 1505    Clinical Impression Statement Patient was diagnosed on 12/03/15 with left high grade DCIS breast cancer.  It is located in the lower outer and upper outer quadrants and measure 4.7 cm,  and is ER/PR positive.  Her multidisciplinary medical team met prior to her assessments to determine a recommended treatment plan.  She is planning to have a left mastectomy and sentinel node biopsy followed by anti-estrogen therapy.  She may benefit from post op PT to regain shoulder range of motion and reduce lymphedema risk.  Due to her alck of  comorbidities, her eval is of low complexity.   Rehab Potential Excellent   Clinical Impairments Affecting Rehab Potential none   PT Frequency One time visit   PT Treatment/Interventions Patient/family education;Therapeutic exercise   PT Next Visit Plan Will f/u after surgery to determine PT needs   PT Home Exercise Plan Post op shoulder ROM HEP   Consulted and Agree with Plan of Care Patient      Patient will benefit from skilled therapeutic intervention in order to improve the following deficits and impairments:  Postural dysfunction, Decreased knowledge of precautions, Pain, Impaired UE functional use, Decreased range of motion  Visit Diagnosis: Cancer of overlapping sites of left breast (HCC)  Abnormal posture   Patient will follow up at outpatient cancer rehab if needed following surgery.  If the patient requires physical therapy at that time, a specific plan will be dictated and sent to the referring physician for approval. The patient was educated today on appropriate basic range of motion exercises to begin post operatively and the importance of attending the After Breast Cancer class following surgery.  Patient was educated today on lymphedema risk reduction practices as it pertains to recommendations that will benefit the patient immediately following surgery.  She verbalized good understanding.  No additional physical therapy is indicated at this time.      Problem List Patient Active Problem List   Diagnosis Date Noted  . Breast cancer of upper-outer quadrant of left female breast ([place]) 12/25/2015    Annia Friendly, PT 01/01/16  3:43 PM  [place], [place], [address] Phone: [phone]   Fax:  [phone]  Name: Tammy Gray MRN: [id number] Date of Birth: [birth year]

## 2016-01-01 NOTE — Patient Instructions (Signed)

## 2016-01-02 NOTE — Progress Notes (Signed)
[place] Psychosocial Distress Screening Spiritual Care  Met with Tammy Gray in [facility] to introduce [facility] team/resources, reviewing distress screen per protocol.  The patient scored a 8 on the Psychosocial Distress Thermometer which indicates severe distress. Also assessed for distress and other psychosocial needs.   ONCBCN DISTRESS SCREENING 01/02/2016  Screening Type Initial Screening  Distress experienced in past week (1-10) 8  Practical problem type Work/school  Emotional problem type Nervousness/Anxiety;Adjusting to illness  Referral to support programs Yes   Per pt, her two biggest stressors are navigating work (very stressful in the last several months) and supporting her sons (16 and 36) while coping with her own distress.  Provided pastoral reflection, empathic listening, and normalization of feelings.  We talked in detail about supporting adolescents/young adults and resources that are available to support her/them, including guidance counselors/university counseling center and [facility].  Per pt, she found the conversation very helpful and reassuring.    Follow up needed: Yes.  Plan to check on pt in infusion to offer further support and encouragement.  Please also page if needs arise/circumstances change.  Thank you.   [place], [place], [facility] Pager [phone] Voicemail [phone]

## 2016-01-06 ENCOUNTER — Telehealth: Payer: Self-pay | Admitting: *Deleted

## 2016-01-06 NOTE — Telephone Encounter (Signed)
Spoke to pt concerning [place] from 12/25/15. Denies questions or concerns regarding dx or treatment care plan. Encourage pt to call with needs. Received verbal understanding. Contact information provided.

## 2016-01-07 ENCOUNTER — Encounter: Payer: Self-pay | Admitting: *Deleted

## 2016-01-07 ENCOUNTER — Ambulatory Visit (HOSPITAL_BASED_OUTPATIENT_CLINIC_OR_DEPARTMENT_OTHER): Payer: Managed Care, Other (non HMO) | Admitting: Genetic Counselor

## 2016-01-07 ENCOUNTER — Other Ambulatory Visit: Payer: Managed Care, Other (non HMO)

## 2016-01-07 DIAGNOSIS — Z315 Encounter for genetic counseling: Secondary | ICD-10-CM

## 2016-01-07 DIAGNOSIS — Z807 Family history of other malignant neoplasms of lymphoid, hematopoietic and related tissues: Secondary | ICD-10-CM

## 2016-01-07 DIAGNOSIS — C50412 Malignant neoplasm of upper-outer quadrant of left female breast: Secondary | ICD-10-CM | POA: Diagnosis not present

## 2016-01-07 DIAGNOSIS — Z800 Family history of malignant neoplasm of digestive organs: Secondary | ICD-10-CM

## 2016-01-07 DIAGNOSIS — Z8042 Family history of malignant neoplasm of prostate: Secondary | ICD-10-CM | POA: Diagnosis not present

## 2016-01-08 ENCOUNTER — Encounter: Payer: Self-pay | Admitting: Genetic Counselor

## 2016-01-08 ENCOUNTER — Encounter: Payer: Self-pay | Admitting: General Practice

## 2016-01-08 NOTE — Progress Notes (Signed)
REFERRING PROVIDER: Lurline Del, MD  PRIMARY PROVIDER:  Paulo Fruit, MD  PRIMARY REASON FOR VISIT:  1. Breast cancer of upper-outer quadrant of left female breast ([place])   2. Family history of prostate cancer in father   48. Family history of colon cancer   4. Family history of Hodgkin's lymphoma      HISTORY OF PRESENT ILLNESS:   Tammy Gray, a 50 y.o. female, was seen for a [facility] cancer genetics consultation at the request of Dr. Jana Hakim due to a personal history of breast cancer at 58 and family history of prostate and other cancers.  Tammy Gray presents to clinic today to discuss the possibility of a hereditary predisposition to cancer, genetic testing, and to further clarify her future cancer risks, as well as potential cancer risks for family members.   In September 2017, at the age of 46, [name] was diagnosed with DCIS of the left breast.  Hormone receptor status was ER/PR+.  This will be treated with mastectomy.  HORMONAL RISK FACTORS:  Menarche was at age 23.  First live birth at age 8.  OCP use for approximately 12-15 years.  Ovaries intact: yes.  Hysterectomy: no.  Menopausal status: premenopausal.  HRT use: 0 years. Colonoscopy: no; not examined. Mammogram within the last year: yes. Number of breast biopsies: 2. Up to date with pelvic exams:  yes. Any excessive radiation exposure/other exposures in the past:  History of secondhand smoke exposure (parents smoked)  Past Medical History:  Diagnosis Date  . Breast cancer of upper-outer quadrant of left female breast ([place]) 12/25/2015  . Herpes 1987  . Hypertension   . IBS (irritable bowel syndrome)     Past Surgical History:  Procedure Laterality Date  . CESAREAN SECTION      Social History   Social History  . Marital status: Divorced    Spouse name: N/A  . Number of children: N/A  . Years of education: N/A   Social History Main Topics  . Smoking status: Never Smoker  . Smokeless tobacco:  Never Used  . Alcohol use No  . Drug use: No  . Sexual activity: Not on file   Other Topics Concern  . Not on file   Social History Narrative  . No narrative on file     FAMILY HISTORY:  We obtained a detailed, 4-generation family history.  Significant diagnoses are listed below: Family History  Problem Relation Age of Onset  . Other Mother 53    hx of hysterectomy to treat endometriosis and heavy bleeding  . Prostate cancer Father 20    unspecified Gleason score; +prostatectomy  . Colon cancer Paternal Grandmother     dx late 60s; w/ mets, d. 74  . Colon cancer Paternal Grandfather     dx. late 71s  . Stroke Maternal Grandmother   . Diverticulitis Maternal Grandmother   . Hodgkin's lymphoma Cousin     maternal 1st cousin dx. in his 20s-30s  . Breast cancer Cousin     paternal 1st cousin, once-removed (on PGF's side of family) dx late 21s    Tammy Gray has two sons, ages 71 and 14.  She has one full sister who is currently 75 and whom has never had cancer.  Her sister has one son who is 48 and cancer-free.  [name] mother and father are both currently 24.  Her mother has never been diagnosed with cancer, but does have a history of endometriosis and heavy bleeding which led  to her having a hysterectomy at 1.  Tammy Gray father was diagnosed with prostate cancer at 74 (unspecified Gleason score) and this was treated with surgery.    Tammy Gray mother had two full brothers.  One brother passed away at 39.  Tammy Gray reports that this uncle was a heavy drinker and also a smoker; he did not have cancer to her knowledge.  He had one son and one daughter, now ages 52 and 17, respectively.  His son was diagnosed with Hodgkin's lymphoma in his 63s-30s.  The other maternal uncle is currently 23 and also has never had cancer.  He has one son who is cancer-free.  Tammy Gray maternal grandmother died of age-related causes at 64.  She had a history of strokes and diverticulitis, but was  never diagnosed with cancer.  Tammy Gray maternal grandfather passed away in his 37s.  He had a history of smoking and also worked on a tobacco farm, but he did not have cancer to Tammy Gray's knowledge.  Tammy Gray has no information for any maternal great aunts/uncles or great grandparents.    Tammy Gray father was an only child.  His mother was diagnosed with colon cancer in her late 81s.  This metastasized and she passed away at 61.  She was adopted, so we do not have any information for her family.  Tammy Gray paternal grandfather was also diagnosed with colon cancer in his late 2s.  He underwent surgery and he did not pass away until the age of 59.  Tammy Gray has limited information for his siblings, but she does report that one of her father's cousins (a paternal first cousin, once-removed to her) was diagnosed with breast cancer in her late 64s.    [name] is unaware of any previous family history of genetic testing for hereditary cancer.  Patient's maternal ancestors are of Caucasian and Native American/Cherokee descent, and paternal ancestors are of Caucasian descent. There is no reported Ashkenazi Jewish ancestry. There is no known consanguinity.  GENETIC COUNSELING ASSESSMENT: Tammy Gray is a 50 y.o. female with a personal and family history of cancer which is somewhat suggestive of a hereditary breast cancer syndrome and predisposition to cancer. We, therefore, discussed and recommended the following at today's visit.   DISCUSSION: We reviewed the characteristics, features and inheritance patterns of hereditary cancer syndromes, particularly those caused by mutations within the BRCA1/2 genes. We also discussed genetic testing, including the appropriate family members to test, the process of testing, insurance coverage and turn-around-time for results. We discussed the implications of a negative, positive and/or variant of uncertain significant result. We recommended Ms. Fels pursue  genetic testing for the 20-gene Breast/Ovarian Cancer Panel through [facility].  The Breast/Ovarian Cancer Panel offered by [facility] [name], MD) includes sequencing and deletion/duplication analysis for the following 19 genes:  ATM, BARD1, BRCA1, BRCA2, BRIP1, CDH1, CHEK2, FANCC, MLH1, MSH2, MSH6, NBN, PALB2, PMS2, PTEN, RAD51C, RAD51D, TP53, and XRCC2.  This panel also includes deletion/duplication analysis (without sequencing) for one gene, EPCAM.  Based on Ms. Kolasa's personal and family history of cancer, she meets medical criteria for genetic testing. Despite that she meets criteria, she may still have an out of pocket cost. We discussed that if her out of pocket cost for testing is over $100, the laboratory will call and confirm whether she wants to proceed with testing.  If the out of pocket cost of testing is less than $100 she will be billed by  the genetic testing laboratory.   PLAN: After considering the risks, benefits, and limitations, Ms. Tagliaferro  provided informed consent to pursue genetic testing and the blood sample was drawn.  However, after her appointment, she called to cancel genetic testing.  She did not discuss her reasons for doing so, but this may have been related to concerns about cost, as we had discussed insurance coverage in detail at her appointment, and she had many questions.  Testing has been cancelled, but she is advised that she can contact us at anytime, if she wishes to change her mind and undergo genetic testing.  Thus, despite our recommendation, Ms. Hanger did not wish to pursue genetic testing currently. We understand this decision, and remain available to coordinate genetic testing at any time in the future. We, therefore, recommend Ms. Gal continue to follow the cancer screening guidelines given by her oncologist and primary healthcare provider.   Lastly, we encouraged Ms. Silvernail to remain in contact with cancer genetics annually so that we  can continuously update the family history and inform her of any changes in cancer genetics and testing that may be of benefit for this family.   Ms.  Dimartino questions were answered to her satisfaction today. Our contact information was provided should additional questions or concerns arise. Thank you for the referral and allowing [place] to share in the care of your patient.   Jeanine Luz, MS, [facility] Certified Genetic Counselor [email or web address] Phone: [phone]  The patient was seen for a total of 60 minutes in face-to-face genetic counseling.  This patient was discussed with Drs. Magrinat, Lindi Adie and/or Burr Medico who agrees with the above.    _______________________________________________________________________ For Office Staff:  Number of people involved in session: 1 Was an Intern/ student involved with case: no

## 2016-01-08 NOTE — Progress Notes (Signed)
[facility] Spiritual Care Note  Reached Deina by phone to offer f/u support.  Per pt, she is doing well and is overall feeling less distress.  She used opportunity to share and process her discernment process about seeking genetic testing, which she describes as the most stressful part of the last week.  Amar states that she became clear that she does not want to take on the additional worry of facing genetic issues, needing to test the whole family and possibly passing worry to her children (one of whom verbalized not wanting to know), etc.  She also named that she knows that this is the right decision for now, and that she may choose to undergo testing at a future time.  Provided reflective listening and emotional support, encouraging Tember to reach out to Support Team as would be meaningful to her.  She plans to attend support group once she has settled into dx a little bit more.   [place], [place], [facility] Pager [phone] Voicemail [phone]

## 2016-01-09 NOTE — H&P (Signed)
Subjective:    Patient ID: Tammy Gray is a 50 y.o. female.  HPI  Patient of Drs. Byerly and Magrinat here for consultation breast reconstruction. Presented following screening MMG with calcification left breast.  In the lateral aspect of the left breast there is an area of 3 mm of course calcifications and 4 cm lateral to this there is an area of developing punctate calcifications measuring 4.7 cm. Biopsy of both areas DCIS, ER/PR +. Patient has elected for mastectomy given span of disease.  Genetics pending. Tamoxifen started neoadjuvantly .  Current 36/38 D, happy with this.  Wt down 18 lb since beginning of year through diet. 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 Has two sons with her at home.   Review of Systems  Allergic/Immunologic: Positive for environmental allergies.  Remainder 12 point review negative     Objective:   Physical Exam  Constitutional: She is oriented to person, place, and time.  Cardiovascular: Normal rate, regular rhythm and normal heart sounds.   Pulmonary/Chest: Effort normal and breath sounds normal.  Abdominal: Soft.  Lymphadenopathy:    She has no axillary adenopathy.  Neurological: She is alert and oriented to person, place, and time.   No masses, grade 2 ptosis bilat   SN to nipple R 28 L 28 cm BW R 16 L 16 (BW chest 12 cm) Nipple to IMF R 12 L 12 cm  Assessment:     Left breast ca UOQ    Plan:     Reviewed options prosthesis and provided brochure for [facility]. Discussed immediate vs delayed, autologous vs implant based reconstruction. Reviewed incisions, drains, OR length, hospital stay and recovery for each. Discussed process of expansion and implant based risks including rupture, MRI surveillance for silicone implants, infection requiring surgery or removal, contracture. Discussed asymmetry one can expect with implant unilateral and natural breast on opposite. Discussed TRAM vs DIEP, latter would require  treatment at tertiary center. Discussed abdominal based complications including failure flap, abdominal bulge or hernia. Discussed future surgery dependent on adjuvant treatments.  Reviewed SSM vs NSM, latter will be asensate and not stimulate. Reviewed with both risks mastectomy flap necrosis requiring additional surgery. Counseled I feel she is too ptotic for NSM and will plan for SSM. Reviewed pictures of NAC reconstruction.  Reviewed portfolio, examples of expanders and implants.  Patient wound like to proceed with reconstruction, desires placement expander at time of mastectomy. Reviewed submuscular position. Plan possible use of acellular dermis. Discussed cadaveric source, use in breast reconstruction.  Irene Limbo, MD [facility] [phone], pin [phone]

## 2016-01-18 ENCOUNTER — Telehealth: Payer: Self-pay

## 2016-01-18 NOTE — Telephone Encounter (Signed)
Faxed fmla paperwork to [facility] 01/16/16

## 2016-01-21 ENCOUNTER — Encounter (HOSPITAL_BASED_OUTPATIENT_CLINIC_OR_DEPARTMENT_OTHER): Payer: Self-pay | Admitting: *Deleted

## 2016-01-24 ENCOUNTER — Encounter (HOSPITAL_BASED_OUTPATIENT_CLINIC_OR_DEPARTMENT_OTHER)
Admission: RE | Admit: 2016-01-24 | Discharge: 2016-01-24 | Disposition: A | Discharge status: Home / Self Care | Payer: Managed Care, Other (non HMO) | Source: Ambulatory Visit | Attending: General Surgery | Admitting: General Surgery

## 2016-01-24 DIAGNOSIS — I10 Essential (primary) hypertension: Secondary | ICD-10-CM | POA: Insufficient documentation

## 2016-01-24 NOTE — Progress Notes (Signed)
Boost drink given with instructions to complete by 0500am. Pt verbalized understanding.

## 2016-01-27 ENCOUNTER — Other Ambulatory Visit: Payer: Self-pay | Admitting: *Deleted

## 2016-01-27 MED ORDER — TAMOXIFEN CITRATE 20 MG PO TABS: 20.0000 mg | ORAL_TABLET | Freq: Every day | ORAL | 3 refills | Status: DC

## 2016-01-27 NOTE — H&P (Signed)
Tammy Gray 01/01/2016 7:33 AM Location: [facility] Patient #: [id number] DOB: 1966-03-18 Undefined / Language: Cleophus Molt / Race: White Female   History of Present Illness Stark Klein MD; 01/01/2016 2:36 PM) The patient is a 50 year old female who presents with breast cancer. Pt is a 50 yo F referred for consultation at the request of Dr. Stann Mainland for new diagnosis of left breast cancer. She had screening detected calcifications that spanned 4.7 cm in the upper and lower outer quadrants. Two biopsies were performed and showed high grade DCIS wtih calcifications and necrosis. ER and PR were positive. Breast density was category C.   Menarche was age 63, She is G2P2 with first child in upper 83s. She has taken OCPs. She still has regular periods. She does not have family history of breast cancer, but has a father with melanoma and prostate cancer and multiple family members with history of colon cancer.   Left UOQ/LOQ 4.7 cm 2 clips 5.7 cm apart HG DCIS ER+/PR +  CBC, CMET essentially normal.    Other Problems Tawni Pummel, RN; 01/01/2016 7:33 AM) Breast Cancer Gastroesophageal Reflux Disease Hemorrhoids High blood pressure Lump In Breast  Past Surgical History Tawni Pummel, RN; 01/01/2016 7:33 AM) Breast Biopsy Left. Cesarean Section - Multiple Oral Surgery  Diagnostic Studies History Tawni Pummel, RN; 01/01/2016 7:33 AM) Colonoscopy never Mammogram within last year Pap Smear 1-5 years ago  Medication History Tawni Pummel, RN; 01/01/2016 7:33 AM) No Current Medications Medications Reconciled  Social History Tawni Pummel, RN; 01/01/2016 7:33 AM) Caffeine use Coffee, Tea. No alcohol use No drug use Tobacco use Never smoker.  Family History Tawni Pummel, RN; 01/01/2016 7:33 AM) Arthritis Mother. Cerebrovascular Accident Family Members In [place] Members In General. Diabetes Mellitus Family Members  In General. Hypertension Father. Ischemic Bowel Disease Family Members In General, Mother, Sister. Melanoma Father. Prostate Cancer Father. Seizure disorder Family Members In General.  Pregnancy / Birth History Tawni Pummel, RN; 01/01/2016 7:33 AM) Age at menarche 29 years. Contraceptive History Oral contraceptives. Gravida 2 Maternal age 30-30 Para 2 Regular periods    Review of Systems Sunday Spillers Ledford RN; 01/01/2016 7:33 AM) General Present- Night Sweats and Weight Loss. Not Present- Appetite Loss, Chills, Fatigue, Fever and Weight Gain. Skin Present- Dryness and Rash. Not Present- Change in Wart/Mole, Hives, Jaundice, New Lesions, Non-Healing Wounds and Ulcer. HEENT Present- Seasonal Allergies and Wears glasses/contact lenses. Not Present- Earache, Hearing Loss, Hoarseness, Nose Bleed, Oral Ulcers, Ringing in the Ears, Sinus Pain, Sore Throat, Visual Disturbances and Yellow Eyes. Respiratory Not Present- Bloody sputum, Chronic Cough, Difficulty Breathing, Snoring and Wheezing. Breast Not Present- Breast Mass, Breast Pain, Nipple Discharge and Skin Changes. Cardiovascular Not Present- Chest Pain, Difficulty Breathing Lying Down, Leg Cramps, Palpitations, Rapid Heart Rate, Shortness of Breath and Swelling of Extremities. Female Genitourinary Not Present- Frequency, Nocturia, Painful Urination, Pelvic Pain and Urgency. Musculoskeletal Not Present- Back Pain, Joint Pain, Joint Stiffness, Muscle Pain, Muscle Weakness and Swelling of Extremities. Neurological Not Present- Decreased Memory, Fainting, Headaches, Numbness, Seizures, Tingling, Tremor, Trouble walking and Weakness. Psychiatric Present- Anxiety. Not Present- Bipolar, Change in Sleep Pattern, Depression, Fearful and Frequent crying. Endocrine Present- Hair Changes. Not Present- Cold Intolerance, Excessive Hunger, Heat Intolerance, Hot flashes and New Diabetes. Hematology Not Present- Blood Thinners, Easy Bruising,  Excessive bleeding, Gland problems, HIV and Persistent Infections.   Physical Exam Stark Klein MD; 01/01/2016 2:41 PM) General Mental Status-Alert. General Appearance-Consistent with stated age. Hydration-Well hydrated. Voice-Normal.  Head  and Neck Head-normocephalic, atraumatic with no lesions or palpable masses. Trachea-midline. Thyroid Gland Characteristics - normal size and consistency.  Eye Eyeball - Bilateral-Extraocular movements intact. Sclera/Conjunctiva - Bilateral-No scleral icterus.  Chest and Lung Exam Chest and lung exam reveals -quiet, even and easy respiratory effort with no use of accessory muscles and on auscultation, normal breath sounds, no adventitious sounds and normal vocal resonance. Inspection Chest Wall - Normal. Back - normal.  Breast Note: no palpable mass. Small hematoma lateral left breast. dense breast tissue in center of breast. no nipple retraction or nipple discharge. breasts symmetric. moderate ptosis. no LAD.   Cardiovascular Cardiovascular examination reveals -normal heart sounds, regular rate and rhythm with no murmurs and normal pedal pulses bilaterally.  Abdomen Inspection Inspection of the abdomen reveals - No Hernias. Palpation/Percussion Palpation and Percussion of the abdomen reveal - Soft, Non Tender, No Rebound tenderness, No Rigidity (guarding) and No hepatosplenomegaly. Auscultation Auscultation of the abdomen reveals - Bowel sounds normal.  Neurologic Neurologic evaluation reveals -alert and oriented x 3 with no impairment of recent or remote memory. Mental Status-Normal.  Musculoskeletal Global Assessment -Note: no gross deformities.  Normal Exam - Left-Upper Extremity Strength Normal and Lower Extremity Strength Normal. Normal Exam - Right-Upper Extremity Strength Normal and Lower Extremity Strength Normal.  Lymphatic Head & Neck  General Head & Neck Lymphatics: Bilateral -  Description - Normal. Axillary  General Axillary Region: Bilateral - Description - Normal. Tenderness - Non Tender. Femoral & Inguinal  Generalized Femoral & Inguinal Lymphatics: Bilateral - Description - No Generalized lymphadenopathy.   Addendum Note(Fransisca Shawn MD; 01/01/2016 3:31 PM) T 98.3 BP 142/73 P 87 RR 20 99% RA 5'3" 143 pounds.    Assessment & Plan Stark Klein MD; 01/01/2016 2:43 PM) MALIGNANT NEOPLASM OF OVERLAPPING SITES OF LEFT BREAST IN FEMALE, ESTROGEN RECEPTOR POSITIVE (C50.812) Impression: This patient has a new dx of high grade DCIS with necrosis in the left breast. This area appears large compared to breast size. She appears to need a mastectomy on the left unless we were to get MRI and be committed to the possibility of multiple surgeries She prefers mastectomy and desires reconstruction.  She will then need a left sentinel lymph node biopsy on that side. We discussed surgery including sentinel lymph node injection and block, incision location, time in hospital, drain placement, recovery, restrictions.  We discussed risks including bleeding, infection, lymphedema, pain or numbness, heart or lung complications, blood clot, possible need for additional procedures or surgeries, and possible death.  Plastics appt 01/13/23 with Dr. Iran Planas. Pt wishes to proceed.  60 min spent in evaluation, examination, counseling, and coordination of care. >50% spent in counseling. Current Plans MR BREAST BILAT WO/W CON [id number]) Referred to Genetic Counseling, for evaluation and follow up (Medical Genetics). Routine. Referred to Surgery - Plastic, for evaluation and follow up (Plastic Surgery). Routine.   Signed by Stark Klein, MD (01/01/2016 2:44 PM)

## 2016-01-28 ENCOUNTER — Encounter (HOSPITAL_COMMUNITY)
Admission: RE | Admit: 2016-01-28 | Discharge: 2016-01-28 | Disposition: A | Discharge status: Home / Self Care | Payer: Managed Care, Other (non HMO) | Source: Ambulatory Visit | Attending: General Surgery | Admitting: General Surgery

## 2016-01-28 ENCOUNTER — Ambulatory Visit (HOSPITAL_BASED_OUTPATIENT_CLINIC_OR_DEPARTMENT_OTHER): Payer: Managed Care, Other (non HMO) | Admitting: Anesthesiology

## 2016-01-28 ENCOUNTER — Encounter (HOSPITAL_BASED_OUTPATIENT_CLINIC_OR_DEPARTMENT_OTHER): Payer: Self-pay | Admitting: *Deleted

## 2016-01-28 ENCOUNTER — Encounter (HOSPITAL_BASED_OUTPATIENT_CLINIC_OR_DEPARTMENT_OTHER)
Admission: RE | Disposition: A | Discharge status: Home / Self Care | Payer: Self-pay | Source: Ambulatory Visit | Attending: General Surgery

## 2016-01-28 ENCOUNTER — Ambulatory Visit (HOSPITAL_BASED_OUTPATIENT_CLINIC_OR_DEPARTMENT_OTHER)
Admission: RE | Admit: 2016-01-28 | Discharge: 2016-01-29 | Disposition: A | Discharge status: Home / Self Care | Payer: Managed Care, Other (non HMO) | Source: Ambulatory Visit | Attending: General Surgery | Admitting: General Surgery

## 2016-01-28 DIAGNOSIS — Z800 Family history of malignant neoplasm of digestive organs: Secondary | ICD-10-CM | POA: Insufficient documentation

## 2016-01-28 DIAGNOSIS — I10 Essential (primary) hypertension: Secondary | ICD-10-CM | POA: Insufficient documentation

## 2016-01-28 DIAGNOSIS — N6012 Diffuse cystic mastopathy of left breast: Secondary | ICD-10-CM | POA: Insufficient documentation

## 2016-01-28 DIAGNOSIS — K219 Gastro-esophageal reflux disease without esophagitis: Secondary | ICD-10-CM | POA: Diagnosis not present

## 2016-01-28 DIAGNOSIS — C50412 Malignant neoplasm of upper-outer quadrant of left female breast: Secondary | ICD-10-CM | POA: Insufficient documentation

## 2016-01-28 DIAGNOSIS — Z808 Family history of malignant neoplasm of other organs or systems: Secondary | ICD-10-CM | POA: Diagnosis not present

## 2016-01-28 DIAGNOSIS — Z170 Estrogen receptor positive status [ER+]: Secondary | ICD-10-CM | POA: Diagnosis not present

## 2016-01-28 DIAGNOSIS — C50812 Malignant neoplasm of overlapping sites of left female breast: Secondary | ICD-10-CM

## 2016-01-28 DIAGNOSIS — C50912 Malignant neoplasm of unspecified site of left female breast: Secondary | ICD-10-CM | POA: Diagnosis present

## 2016-01-28 DIAGNOSIS — Z8042 Family history of malignant neoplasm of prostate: Secondary | ICD-10-CM | POA: Insufficient documentation

## 2016-01-28 HISTORY — PX: BREAST RECONSTRUCTION WITH PLACEMENT OF TISSUE EXPANDER AND FLEX HD (ACELLULAR HYDRATED DERMIS): SHX6295

## 2016-01-28 HISTORY — PX: MASTECTOMY W/ SENTINEL NODE BIOPSY: SHX2001

## 2016-01-28 SURGERY — MASTECTOMY WITH SENTINEL LYMPH NODE BIOPSY
Anesthesia: Regional | Site: Breast | Laterality: Left

## 2016-01-28 MED ORDER — ONDANSETRON HCL 4 MG/2ML IJ SOLN: 4.0000 mg | Freq: Four times a day (QID) | INTRAMUSCULAR | Status: DC | PRN

## 2016-01-28 MED ORDER — PROMETHAZINE HCL 25 MG/ML IJ SOLN: 6.2500 mg | INTRAMUSCULAR | Status: DC | PRN

## 2016-01-28 MED ORDER — PROPOFOL 10 MG/ML IV BOLUS
INTRAVENOUS | Status: DC | PRN
  Administered 2016-01-28: 150 mg via INTRAVENOUS

## 2016-01-28 MED ORDER — SUGAMMADEX SODIUM 200 MG/2ML IV SOLN
INTRAVENOUS | Status: AC
  Filled 2016-01-28: qty 2

## 2016-01-28 MED ORDER — FENTANYL CITRATE (PF) 100 MCG/2ML IJ SOLN
50.0000 ug | INTRAMUSCULAR | Status: AC | PRN
  Administered 2016-01-28: 50 ug via INTRAVENOUS
  Administered 2016-01-28: 25 ug via INTRAVENOUS
  Administered 2016-01-28: 100 ug via INTRAVENOUS
  Administered 2016-01-28 (×5): 25 ug via INTRAVENOUS

## 2016-01-28 MED ORDER — DICYCLOMINE HCL 10 MG PO CAPS: 10.0000 mg | ORAL_CAPSULE | ORAL | Status: DC | PRN

## 2016-01-28 MED ORDER — LIDOCAINE 2% (20 MG/ML) 5 ML SYRINGE
INTRAMUSCULAR | Status: AC
  Filled 2016-01-28: qty 5

## 2016-01-28 MED ORDER — LACTATED RINGERS IV SOLN
INTRAVENOUS | Status: DC
  Administered 2016-01-28 (×2): via INTRAVENOUS
  Administered 2016-01-28: 10 mL/h via INTRAVENOUS

## 2016-01-28 MED ORDER — CEFAZOLIN SODIUM-DEXTROSE 2-4 GM/100ML-% IV SOLN
2.0000 g | INTRAVENOUS | Status: AC
  Administered 2016-01-28: 2 g via INTRAVENOUS

## 2016-01-28 MED ORDER — FENTANYL CITRATE (PF) 100 MCG/2ML IJ SOLN
INTRAMUSCULAR | Status: AC
  Filled 2016-01-28: qty 2

## 2016-01-28 MED ORDER — HYDROMORPHONE HCL 1 MG/ML IJ SOLN
INTRAMUSCULAR | Status: AC
  Filled 2016-01-28: qty 1

## 2016-01-28 MED ORDER — LISINOPRIL 5 MG PO TABS: 5.0000 mg | ORAL_TABLET | Freq: Every day | ORAL | Status: DC

## 2016-01-28 MED ORDER — ACETAMINOPHEN 500 MG PO TABS
ORAL_TABLET | ORAL | Status: AC
  Filled 2016-01-28: qty 2

## 2016-01-28 MED ORDER — KETOROLAC TROMETHAMINE 30 MG/ML IJ SOLN
30.0000 mg | Freq: Three times a day (TID) | INTRAMUSCULAR | Status: AC
  Administered 2016-01-28 – 2016-01-29 (×3): 30 mg via INTRAVENOUS

## 2016-01-28 MED ORDER — ROCURONIUM BROMIDE 10 MG/ML (PF) SYRINGE
PREFILLED_SYRINGE | INTRAVENOUS | Status: AC
Start: 2016-01-28 — End: 2016-01-28
  Filled 2016-01-28: qty 10

## 2016-01-28 MED ORDER — HYDROCODONE-ACETAMINOPHEN 5-325 MG PO TABS
1.0000 | ORAL_TABLET | ORAL | Status: DC | PRN
  Administered 2016-01-28 (×2): 1 via ORAL
  Administered 2016-01-29 (×2): 2 via ORAL
  Filled 2016-01-28 (×2): qty 1
  Filled 2016-01-28 (×2): qty 2

## 2016-01-28 MED ORDER — KETOROLAC TROMETHAMINE 30 MG/ML IJ SOLN
INTRAMUSCULAR | Status: AC
Start: 2016-01-28 — End: 2016-01-28
  Filled 2016-01-28: qty 1

## 2016-01-28 MED ORDER — HYDROMORPHONE HCL 1 MG/ML IJ SOLN
0.2500 mg | INTRAMUSCULAR | Status: DC | PRN
  Administered 2016-01-28 (×2): 0.5 mg via INTRAVENOUS

## 2016-01-28 MED ORDER — ACETAMINOPHEN 500 MG PO TABS
1000.0000 mg | ORAL_TABLET | ORAL | Status: AC
  Administered 2016-01-28: 1000 mg via ORAL

## 2016-01-28 MED ORDER — TAMOXIFEN CITRATE 20 MG PO TABS: 20.0000 mg | ORAL_TABLET | Freq: Every day | ORAL | Status: DC

## 2016-01-28 MED ORDER — SUCCINYLCHOLINE CHLORIDE 200 MG/10ML IV SOSY
PREFILLED_SYRINGE | INTRAVENOUS | Status: DC | PRN
  Administered 2016-01-28: 100 mg via INTRAVENOUS

## 2016-01-28 MED ORDER — SUCCINYLCHOLINE CHLORIDE 200 MG/10ML IV SOSY
PREFILLED_SYRINGE | INTRAVENOUS | Status: AC
  Filled 2016-01-28: qty 10

## 2016-01-28 MED ORDER — MIDAZOLAM HCL 2 MG/2ML IJ SOLN
1.0000 mg | INTRAMUSCULAR | Status: DC | PRN
  Administered 2016-01-28 (×2): 2 mg via INTRAVENOUS

## 2016-01-28 MED ORDER — CHLORHEXIDINE GLUCONATE CLOTH 2 % EX PADS: 6.0000 | MEDICATED_PAD | Freq: Once | CUTANEOUS | Status: DC

## 2016-01-28 MED ORDER — GABAPENTIN 300 MG PO CAPS
300.0000 mg | ORAL_CAPSULE | ORAL | Status: AC
  Administered 2016-01-28: 300 mg via ORAL

## 2016-01-28 MED ORDER — HYDROCHLOROTHIAZIDE 12.5 MG PO CAPS: 12.5000 mg | ORAL_CAPSULE | Freq: Every day | ORAL | Status: DC

## 2016-01-28 MED ORDER — GENTAMICIN SULFATE 40 MG/ML IJ SOLN
INTRAMUSCULAR | Status: DC | PRN
  Administered 2016-01-28: 1000 mL

## 2016-01-28 MED ORDER — MIDAZOLAM HCL 2 MG/2ML IJ SOLN
INTRAMUSCULAR | Status: AC
  Filled 2016-01-28: qty 2

## 2016-01-28 MED ORDER — ROCURONIUM BROMIDE 100 MG/10ML IV SOLN
INTRAVENOUS | Status: DC | PRN
  Administered 2016-01-28: 50 mg via INTRAVENOUS

## 2016-01-28 MED ORDER — PROPOFOL 10 MG/ML IV BOLUS
INTRAVENOUS | Status: AC
Start: 2016-01-28 — End: 2016-01-28
  Filled 2016-01-28: qty 20

## 2016-01-28 MED ORDER — SCOPOLAMINE 1 MG/3DAYS TD PT72
1.0000 | MEDICATED_PATCH | Freq: Once | TRANSDERMAL | Status: DC | PRN
Start: 2016-01-28 — End: 2016-01-28

## 2016-01-28 MED ORDER — ONDANSETRON HCL 4 MG/2ML IJ SOLN
INTRAMUSCULAR | Status: DC | PRN
  Administered 2016-01-28: 4 mg via INTRAVENOUS

## 2016-01-28 MED ORDER — BUPIVACAINE-EPINEPHRINE (PF) 0.5% -1:200000 IJ SOLN
INTRAMUSCULAR | Status: DC | PRN
  Administered 2016-01-28: 30 mL

## 2016-01-28 MED ORDER — CEFAZOLIN SODIUM-DEXTROSE 2-4 GM/100ML-% IV SOLN
2.0000 g | Freq: Three times a day (TID) | INTRAVENOUS | Status: DC
  Administered 2016-01-28 – 2016-01-29 (×2): 2 g via INTRAVENOUS
  Filled 2016-01-28 (×2): qty 100

## 2016-01-28 MED ORDER — CEFAZOLIN SODIUM-DEXTROSE 2-4 GM/100ML-% IV SOLN
INTRAVENOUS | Status: AC
  Filled 2016-01-28: qty 100

## 2016-01-28 MED ORDER — KCL IN DEXTROSE-NACL 20-5-0.45 MEQ/L-%-% IV SOLN
INTRAVENOUS | Status: DC
  Administered 2016-01-28: 14:00:00 via INTRAVENOUS
  Filled 2016-01-28: qty 1000

## 2016-01-28 MED ORDER — DEXAMETHASONE SODIUM PHOSPHATE 10 MG/ML IJ SOLN
INTRAMUSCULAR | Status: AC
  Filled 2016-01-28: qty 1

## 2016-01-28 MED ORDER — LIDOCAINE 2% (20 MG/ML) 5 ML SYRINGE
INTRAMUSCULAR | Status: DC | PRN
  Administered 2016-01-28: 40 mg via INTRAVENOUS

## 2016-01-28 MED ORDER — CELECOXIB 200 MG PO CAPS
ORAL_CAPSULE | ORAL | Status: AC
  Filled 2016-01-28: qty 2

## 2016-01-28 MED ORDER — GLYCOPYRROLATE 0.2 MG/ML IJ SOLN: 0.2000 mg | Freq: Once | INTRAMUSCULAR | Status: DC | PRN

## 2016-01-28 MED ORDER — CELECOXIB 400 MG PO CAPS
400.0000 mg | ORAL_CAPSULE | ORAL | Status: AC
Start: 2016-01-28 — End: 2016-01-28
  Administered 2016-01-28: 400 mg via ORAL

## 2016-01-28 MED ORDER — DEXAMETHASONE SODIUM PHOSPHATE 4 MG/ML IJ SOLN
INTRAMUSCULAR | Status: DC | PRN
  Administered 2016-01-28: 10 mg via INTRAVENOUS

## 2016-01-28 MED ORDER — SUGAMMADEX SODIUM 200 MG/2ML IV SOLN
INTRAVENOUS | Status: DC | PRN
Start: 2016-01-28 — End: 2016-01-28
  Administered 2016-01-28: 200 mg via INTRAVENOUS

## 2016-01-28 MED ORDER — ONDANSETRON 4 MG PO TBDP: 4.0000 mg | ORAL_TABLET | Freq: Four times a day (QID) | ORAL | Status: DC | PRN

## 2016-01-28 MED ORDER — ONDANSETRON HCL 4 MG/2ML IJ SOLN
INTRAMUSCULAR | Status: AC
  Filled 2016-01-28: qty 2

## 2016-01-28 MED ORDER — TECHNETIUM TC 99M SULFUR COLLOID FILTERED
1.0000 | Freq: Once | INTRAVENOUS | Status: AC | PRN
  Administered 2016-01-28: 1 via INTRADERMAL

## 2016-01-28 MED ORDER — HYDROCODONE-ACETAMINOPHEN 7.5-325 MG PO TABS: 1.0000 | ORAL_TABLET | Freq: Once | ORAL | Status: DC | PRN

## 2016-01-28 MED ORDER — METHOCARBAMOL 500 MG PO TABS: 500.0000 mg | ORAL_TABLET | Freq: Three times a day (TID) | ORAL | Status: DC | PRN

## 2016-01-28 MED ORDER — HYDROMORPHONE HCL 1 MG/ML IJ SOLN
0.5000 mg | INTRAMUSCULAR | Status: DC | PRN
  Administered 2016-01-28: 1 mg via INTRAVENOUS
  Filled 2016-01-28: qty 1

## 2016-01-28 MED ORDER — GABAPENTIN 300 MG PO CAPS
ORAL_CAPSULE | ORAL | Status: AC
  Filled 2016-01-28: qty 1

## 2016-01-28 MED ORDER — KETOROLAC TROMETHAMINE 30 MG/ML IJ SOLN
INTRAMUSCULAR | Status: AC
  Filled 2016-01-28: qty 1

## 2016-01-28 SURGICAL SUPPLY — 90 items
ALLOGRAFT DERM CORTIV 4X12 (Tissue Mesh) ×2 IMPLANT
BAG DECANTER FOR FLEXI CONT (MISCELLANEOUS) ×2 IMPLANT
BINDER BREAST LRG (GAUZE/BANDAGES/DRESSINGS) ×2 IMPLANT
BINDER BREAST MEDIUM (GAUZE/BANDAGES/DRESSINGS) IMPLANT
BINDER BREAST XLRG (GAUZE/BANDAGES/DRESSINGS) IMPLANT
BINDER BREAST XXLRG (GAUZE/BANDAGES/DRESSINGS) IMPLANT
BLADE HEX COATED 2.75 (ELECTRODE) ×2 IMPLANT
BLADE SURG 10 STRL SS (BLADE) ×4 IMPLANT
BLADE SURG 15 STRL LF DISP TIS (BLADE) ×1 IMPLANT
BLADE SURG 15 STRL SS (BLADE) ×1
BNDG GAUZE ELAST 4 BULKY (GAUZE/BANDAGES/DRESSINGS) IMPLANT
CANISTER SUCT 1200ML W/VALVE (MISCELLANEOUS) ×4 IMPLANT
CHLORAPREP W/TINT 26ML (MISCELLANEOUS) ×6 IMPLANT
CLIP TI LARGE 6 (CLIP) ×2 IMPLANT
CLIP TI MEDIUM 6 (CLIP) ×2 IMPLANT
CLIP TI WIDE RED SMALL 6 (CLIP) ×2 IMPLANT
COVER MAYO STAND STRL (DRAPES) ×2 IMPLANT
COVER PROBE W GEL 5X96 (DRAPES) ×2 IMPLANT
DECANTER SPIKE VIAL GLASS SM (MISCELLANEOUS) IMPLANT
DERMABOND ADVANCED (GAUZE/BANDAGES/DRESSINGS)
DERMABOND ADVANCED .7 DNX12 (GAUZE/BANDAGES/DRESSINGS) IMPLANT
DEVICE DISSECT PLASMABLAD 3.0S (MISCELLANEOUS) IMPLANT
DRAIN CHANNEL 15F RND FF W/TCR (WOUND CARE) IMPLANT
DRAIN CHANNEL 19F RND (DRAIN) ×2 IMPLANT
DRAPE UTILITY XL STRL (DRAPES) ×4 IMPLANT
DRSG PAD ABDOMINAL 8X10 ST (GAUZE/BANDAGES/DRESSINGS) ×4 IMPLANT
DRSG TEGADERM 2-3/8X2-3/4 SM (GAUZE/BANDAGES/DRESSINGS) ×2 IMPLANT
DRSG TEGADERM 4X10 (GAUZE/BANDAGES/DRESSINGS) ×2 IMPLANT
DRSG TEGADERM 4X4.75 (GAUZE/BANDAGES/DRESSINGS) IMPLANT
ELECT BLADE 4.0 EZ CLEAN MEGAD (MISCELLANEOUS) ×2
ELECT BLADE 6.5 .24CM SHAFT (ELECTRODE) ×2 IMPLANT
ELECT COATED BLADE 2.86 ST (ELECTRODE) IMPLANT
ELECT REM PT RETURN 9FT ADLT (ELECTROSURGICAL) ×2
ELECTRODE BLDE 4.0 EZ CLN MEGD (MISCELLANEOUS) ×1 IMPLANT
ELECTRODE REM PT RTRN 9FT ADLT (ELECTROSURGICAL) ×1 IMPLANT
EVACUATOR SILICONE 100CC (DRAIN) ×2 IMPLANT
EXPANDER TISSUE MX 400CC (Breast) ×1 IMPLANT
GAUZE SPONGE 4X4 12PLY STRL (GAUZE/BANDAGES/DRESSINGS) ×2 IMPLANT
GLOVE BIO SURGEON STRL SZ 6 (GLOVE) ×6 IMPLANT
GLOVE BIOGEL PI IND STRL 6.5 (GLOVE) ×1 IMPLANT
GLOVE BIOGEL PI INDICATOR 6.5 (GLOVE) ×1
GLOVE SURG SS PI 7.0 STRL IVOR (GLOVE) ×2 IMPLANT
GOWN STRL REUS W/ TWL LRG LVL3 (GOWN DISPOSABLE) ×2 IMPLANT
GOWN STRL REUS W/TWL 2XL LVL3 (GOWN DISPOSABLE) ×2 IMPLANT
GOWN STRL REUS W/TWL LRG LVL3 (GOWN DISPOSABLE) ×2
ILLUMINATOR WAVEGUIDE N/F (MISCELLANEOUS) IMPLANT
IV NS 500ML (IV SOLUTION) ×1
IV NS 500ML BAXH (IV SOLUTION) ×1 IMPLANT
KIT FILL SYSTEM UNIVERSAL (SET/KITS/TRAYS/PACK) ×2 IMPLANT
LIGHT WAVEGUIDE WIDE FLAT (MISCELLANEOUS) ×2 IMPLANT
LIQUID BAND (GAUZE/BANDAGES/DRESSINGS) ×2 IMPLANT
NDL SAFETY ECLIPSE 18X1.5 (NEEDLE) IMPLANT
NEEDLE HYPO 18GX1.5 SHARP (NEEDLE)
NEEDLE HYPO 25X1 1.5 SAFETY (NEEDLE) IMPLANT
NEEDLE SPNL 18GX3.5 QUINCKE PK (NEEDLE) IMPLANT
NEEDLE SPNL 22GX3.5 QUINCKE BK (NEEDLE) IMPLANT
NS IRRIG 1000ML POUR BTL (IV SOLUTION) ×2 IMPLANT
PACK BASIN DAY SURGERY FS (CUSTOM PROCEDURE TRAY) ×2 IMPLANT
PACK UNIVERSAL I (CUSTOM PROCEDURE TRAY) ×2 IMPLANT
PENCIL BUTTON HOLSTER BLD 10FT (ELECTRODE) ×2 IMPLANT
PIN SAFETY STERILE (MISCELLANEOUS) ×2 IMPLANT
PLASMABLADE 3.0S (MISCELLANEOUS)
SHEET MEDIUM DRAPE 40X70 STRL (DRAPES) ×2 IMPLANT
SLEEVE SCD COMPRESS KNEE MED (MISCELLANEOUS) ×2 IMPLANT
SPONGE LAP 18X18 X RAY DECT (DISPOSABLE) ×8 IMPLANT
STAPLER VISISTAT 35W (STAPLE) ×2 IMPLANT
STOCKINETTE IMPERVIOUS LG (DRAPES) ×2 IMPLANT
STRIP CLOSURE SKIN 1/2X4 (GAUZE/BANDAGES/DRESSINGS) ×2 IMPLANT
SUT ETHILON 2 0 FS 18 (SUTURE) ×2 IMPLANT
SUT MNCRL AB 4-0 PS2 18 (SUTURE) ×4 IMPLANT
SUT SILK 0 TIES 10X30 (SUTURE) IMPLANT
SUT SILK 2 0 SH (SUTURE) ×2 IMPLANT
SUT VIC AB 0 CT1 27 (SUTURE) ×3
SUT VIC AB 0 CT1 27XBRD ANBCTR (SUTURE) ×3 IMPLANT
SUT VIC AB 3-0 SH 27 (SUTURE) ×4
SUT VIC AB 3-0 SH 27X BRD (SUTURE) ×4 IMPLANT
SUT VICRYL 3-0 CR8 SH (SUTURE) IMPLANT
SUT VICRYL 4-0 PS2 18IN ABS (SUTURE) ×2 IMPLANT
SUT VICRYL AB 2 0 TIE (SUTURE) IMPLANT
SUT VICRYL AB 2 0 TIES (SUTURE)
SYR 50ML LL SCALE MARK (SYRINGE) IMPLANT
SYR BULB IRRIGATION 50ML (SYRINGE) ×2 IMPLANT
SYR CONTROL 10ML LL (SYRINGE) IMPLANT
TAPE MEASURE VINYL STERILE (MISCELLANEOUS) IMPLANT
TISSUE EXPANDER MX 400CC (Breast) ×2 IMPLANT
TOWEL OR 17X24 6PK STRL BLUE (TOWEL DISPOSABLE) ×4 IMPLANT
TOWEL OR NON WOVEN STRL DISP B (DISPOSABLE) IMPLANT
TUBE CONNECTING 20X1/4 (TUBING) ×2 IMPLANT
UNDERPAD 30X30 (UNDERPADS AND DIAPERS) ×4 IMPLANT
YANKAUER SUCT BULB TIP NO VENT (SUCTIONS) ×2 IMPLANT

## 2016-01-28 NOTE — Anesthesia Procedure Notes (Signed)
Anesthesia Regional Block:  Pectoralis block  Pre-Anesthetic Checklist: ,, timeout performed, Correct Patient, Correct Site, Correct Laterality, Correct Procedure, Correct Position, site marked, Risks and benefits discussed,  Surgical consent,  Pre-op evaluation,  At surgeon's request and post-op pain management  Laterality: Left  Prep: chloraprep       Needles:  Injection technique: Single-shot  Needle Type: Echogenic Needle     Needle Length: 9cm 9 cm Needle Gauge: 21 and 21 G    Additional Needles:  Procedures: ultrasound guided (picture in chart) Pectoralis block Narrative:  Start time: 01/28/2016 7:58 AM End time: 01/28/2016 8:05 AM Injection made incrementally with aspirations every 5 mL.  Performed by: Personally  Anesthesiologist: Suzette Battiest

## 2016-01-28 NOTE — Anesthesia Postprocedure Evaluation (Signed)
Anesthesia Post Note  Patient: Tammy Gray  Procedure(s) Performed: Procedure(s) (LRB): LEFT MASTECTOMY WITH SENTINEL LYMPH NODE BIOPSY (Left) LEFT BREAST RECONSTRUCTION WITH PLACEMENT OF TISSUE EXPANDER AND CORTIVA (Left)  Patient location during evaluation: PACU Anesthesia Type: General and Regional Level of consciousness: awake and alert Pain management: pain level controlled Vital Signs Assessment: post-procedure vital signs reviewed and stable Respiratory status: spontaneous breathing, nonlabored ventilation, respiratory function stable and patient connected to nasal cannula oxygen Cardiovascular status: blood pressure returned to baseline and stable Postop Assessment: no signs of nausea or vomiting Anesthetic complications: no    Last Vitals:  Vitals:   01/28/16 1400 01/28/16 1415  BP: 130/65 109/61  Pulse: 81 71  Resp: 20 (!) 23  Temp:      Last Pain:  Vitals:   01/28/16 1411  TempSrc:   PainSc: 3                  [name]

## 2016-01-28 NOTE — Interval H&P Note (Signed)
History and Physical Interval Note:  01/28/2016 6:51 AM  Tammy Gray  has presented today for surgery, with the diagnosis of LEFT BREAST CANCER  The various methods of treatment have been discussed with the patient and family. After consideration of risks, benefits and other options for treatment, the patient has consented to  Procedure(s): LEFT MASTECTOMY WITH SENTINEL LYMPH NODE BIOPSY (Left) LEFT BREAST RECONSTRUCTION WITH PLACEMENT OF TISSUE EXPANDER AND POSSIBLE CORTIVA (Left) as a surgical intervention .  The patient's history has been reviewed, patient examined, no change in status, stable for surgery.  I have reviewed the patient's chart and labs.  Questions were answered to the patient's satisfaction.     Donya Hitch

## 2016-01-28 NOTE — Progress Notes (Signed)
Emotional support during breast injections °

## 2016-01-28 NOTE — Anesthesia Preprocedure Evaluation (Addendum)
Anesthesia Evaluation  Patient identified by MRN, date of birth, ID band Patient awake    Reviewed: Allergy & Precautions, NPO status , Patient's Chart, lab work & pertinent test results  Airway Mallampati: II  TM Distance: >3 FB Neck ROM: Full    Dental  (+) Dental Advisory Given   Pulmonary neg pulmonary ROS,    breath sounds clear to auscultation       Cardiovascular hypertension, Pt. on medications  Rhythm:Regular Rate:Normal     Neuro/Psych negative neurological ROS     GI/Hepatic negative GI ROS, Neg liver ROS,   Endo/Other  negative endocrine ROS  Renal/GU negative Renal ROS     Musculoskeletal   Abdominal   Peds  Hematology negative hematology ROS (+)   Anesthesia Other Findings   Reproductive/Obstetrics                             Anesthesia Physical Anesthesia Plan  ASA: II  Anesthesia Plan: General and Regional   Post-op Pain Management:  Regional for Post-op pain   Induction: Intravenous  Airway Management Planned: Oral ETT  Additional Equipment:   Intra-op Plan:   Post-operative Plan: Extubation in OR  Informed Consent: I have reviewed the patients History and Physical, chart, labs and discussed the procedure including the risks, benefits and alternatives for the proposed anesthesia with the patient or authorized representative who has indicated his/her understanding and acceptance.   Dental advisory given  Plan Discussed with: CRNA  Anesthesia Plan Comments:        Anesthesia Quick Evaluation

## 2016-01-28 NOTE — Anesthesia Procedure Notes (Signed)
Procedure Name: Intubation Date/Time: 01/28/2016 9:40 AM Performed by: Denna Haggard D Pre-anesthesia Checklist: Patient identified, Emergency Drugs available, Suction available and Patient being monitored Patient Re-evaluated:Patient Re-evaluated prior to inductionOxygen Delivery Method: Circle system utilized Preoxygenation: Pre-oxygenation with 100% oxygen Intubation Type: IV induction Ventilation: Mask ventilation without difficulty Laryngoscope Size: Mac and 3 Grade View: Grade I Tube type: Oral Tube size: 7.0 mm Number of attempts: 1 Airway Equipment and Method: Stylet,  Oral airway and LTA kit utilized Placement Confirmation: ETT inserted through vocal cords under direct vision,  positive ETCO2 and breath sounds checked- equal and bilateral Secured at: 22 cm Tube secured with: Tape Dental Injury: Teeth and Oropharynx as per pre-operative assessment

## 2016-01-28 NOTE — Op Note (Addendum)
Left Mastectomy with Sentinel Node Biopsy Procedure Note  Indications: This patient presents with history of left breast cancer with clinically negative axillary lymph node exam.  Pre-operative Diagnosis: left breast cancer, upper outer quadrant, cTis  Post-operative Diagnosis: Same  Surgeon: Stark Klein   Anesthesia: General endotracheal anesthesia and pectoral block.  ASA Class: 2  Procedure Details  The patient was seen in the Holding Room. The risks, benefits, complications, treatment options, and expected outcomes were discussed with the patient. The possibilities of reaction to medication, pulmonary aspiration, bleeding, infection, the need for additional procedures, failure to diagnose a condition, and creating a complication requiring transfusion or operation were discussed with the patient. The patient concurred with the proposed plan, giving informed consent.  The site of surgery properly noted/marked. The patient was taken to Operating Room # 6, identified as Sherilyn Banker, and the procedure verified as left Mastectomy and Sentinel Node Biopsy. A Time Out was held and the above information confirmed.    Using a hand-held gamma probe, axillary sentinel nodes were identified.  4 level 2 axillary sentinel nodes were removed and submitted to pathology.  The findings are below.  The lymphovascular channels were clipped with metal clips.      After induction of anesthesia, the leftt arm, breast, and chest were prepped and draped in standard fashion.  The borders of the breast were identified and marked.  A circumareolar incision was made around the nipple.  Mastectomy hooks were used to provide elevation of the skin edges, and the bovie was used to create the mastectomy flaps.  Lighted retractors were used to assist.  The dissection was taken to the fascia of the pectoralis major.  The penetrating vessels were clipped.  The superior flap was taken medially to the lateral sternal border,  superiorly to the inferior border of the clavicle.  The inferior flap was similarly created, inferiorly to the inframammary fold and laterally to the border of the latissimus.  The breast was taken off including the pectoralis fascia and the axillary tail marked.     The wound was irrigated.  Hemostasis was achieved with cautery.     Sterile dressings were applied. At the end of the operation, all sponge, instrument, and needle counts were correct.  Findings: grossly clear surgical margins, four lymph nodes taken (all hot, cps 790 for greatest LN and 230 for lowest.    Estimated Blood Loss:  less than 50 mL         Drains: per Dr. Iran Planas                Specimens: L breast and 4 axillary sentinel nodes         Complications:  None; patient tolerated the procedure well.         Disposition: PACU - hemodynamically stable.         Condition: stable

## 2016-01-28 NOTE — Interval H&P Note (Signed)
History and Physical Interval Note:  01/28/2016 9:07 AM  Tammy Gray  has presented today for surgery, with the diagnosis of LEFT BREAST CANCER  The various methods of treatment have been discussed with the patient and family. After consideration of risks, benefits and other options for treatment, the patient has consented to  Procedure(s): LEFT MASTECTOMY WITH SENTINEL LYMPH NODE BIOPSY (Left) LEFT BREAST RECONSTRUCTION WITH PLACEMENT OF TISSUE EXPANDER AND POSSIBLE CORTIVA (Left) as a surgical intervention .  The patient's history has been reviewed, patient examined, no change in status, stable for surgery.  I have reviewed the patient's chart and labs.  Questions were answered to the patient's satisfaction.     Rushawn Capshaw

## 2016-01-28 NOTE — Transfer of Care (Signed)
Immediate Anesthesia Transfer of Care Note  Patient: Tammy Gray  Procedure(s) Performed: Procedure(s) (LRB): LEFT MASTECTOMY WITH SENTINEL LYMPH NODE BIOPSY (Left) LEFT BREAST RECONSTRUCTION WITH PLACEMENT OF TISSUE EXPANDER AND CORTIVA (Left)  Patient Location: PACU  Anesthesia Type: General  Level of Consciousness: awake, oriented, sedated and patient cooperative  Airway & Oxygen Therapy: Patient Spontanous Breathing and Patient connected to face mask oxygen  Post-op Assessment: Report given to PACU RN and Post -op Vital signs reviewed and stable  Post vital signs: Reviewed and stable  Complications: No apparent anesthesia complications Last Vitals:  Vitals:   01/28/16 0813 01/28/16 1311  BP:  134/68  Pulse: 80 (P) 90  Resp: (!) 22 12  Temp:  (P) 37.1 C

## 2016-01-28 NOTE — Progress Notes (Signed)
Assisted Dr. Rob Fitzgerald with left, ultrasound guided, pectoralis block. Side rails up, monitors on throughout procedure. See vital signs in flow sheet. Tolerated Procedure well. 

## 2016-01-28 NOTE — Op Note (Signed)
Operative Note   DATE OF OPERATION: 10.10.17  LOCATION: [facility]-observation  SURGICAL DIVISION: Plastic Surgery  PREOPERATIVE DIAGNOSES:  1. Left breast cancer  POSTOPERATIVE DIAGNOSES:  same  PROCEDURE:  1. Left breast reconstruction with tissue expander 2. Acellular dermis Cortiva for breast reconstruction 48 cm2   SURGEON: Irene Limbo MD MBA  ASSISTANT: none  ANESTHESIA:  General.   EBL: 150 ml for entire case  COMPLICATIONS: None immediate.   INDICATIONS FOR PROCEDURE:  The patient, Tammy Gray, is a 50 y.o. female born on 12-17-1965, is here for immediate expander based reconstruction following skin sparing mastectomy.   FINDINGS: Natrelle 133MX-12-T 400 ml tissue expander placed, initial fill volume 200 ml. SN [id number]  DESCRIPTION OF PROCEDURE:  The patient was marked with the patient in the preoperative area to mark sternal notch, chest midline, anterior axillary lines and inframammary folds. The patient was taken to the operating room. SCDs were placed and IV antibiotics were given. The patient's operative site was prepped and draped in a sterile fashion. A time out was performed and all information was confirmed to be correct. Following completion of mastectomy, the inferior insertions of pectoralis were divided. Submuscular dissection completed toward clavicle. Cortiva was perforated and sewn to inferior border of pectoralis major with running 3-0 vicryl. A 19 Fr drain was placed in subcutaneous position laterally and submuscular position medialy and secured to skin with 2-0 nylon. The cavity was irrigated with solution containing Ancef, gentamicin, and bacitracin. Hemostasis was ensured. Cavity irrigated with Betadine. The tissue expander was prepared and placed in submuscular position. The expander was secured to chest wall with a 3-0 vicryl. The inferior border of the acellular dermis was inset to inframammary fold, abdominal wall fascia and laterally was  secured to serratus fascia. Air was then used to fill the expander to nominal fill volume. The mastectomy flap soft tissue over superior pole was then advance inferiorly and secured to anterior surface pectoralis with 0-vicryl. Laterally the mastectomy flap was advanced anteriorly and the subcutaneous tissue and superficial fascia was secured to serratus fascia and acellular dermis with 0-vicryl. The skin flap was then tailor tacked closed over the expander creating final Wise pattern scar. The skin marked by tailor tacking was largely de-epithelialized and inferiorly based dermal pedicle maintained. This dermal pedicle was redraped superiorly over expander and acellular dermis and secured to pectoralis with interrupted 0-vicryl. Skin closure completed with 3-0 vicryl in fascial layer and 4-0 vicryl in dermis. Skin closure completed with 4-0 monocryl subcuticular and tissue adhesive. The port was accessed and filled to 200 ml. Transparent, adherent dressings applied. Dry dressing and breast binder applied  The patient was allowed to wake from anesthesia, extubated and taken to the recovery room in satisfactory condition.   SPECIMENS: additional mastectomy flap left  DRAINS: 19 Fr JP drain left chest  Irene Limbo, MD [facility] Plastic & Reconstructive Surgery [phone], pin [phone]

## 2016-01-29 DIAGNOSIS — C50412 Malignant neoplasm of upper-outer quadrant of left female breast: Secondary | ICD-10-CM | POA: Diagnosis not present

## 2016-01-29 MED ORDER — KETOROLAC TROMETHAMINE 30 MG/ML IJ SOLN
INTRAMUSCULAR | Status: AC
  Filled 2016-01-29: qty 1

## 2016-01-29 MED ORDER — METHOCARBAMOL 500 MG PO TABS: 500.0000 mg | ORAL_TABLET | Freq: Three times a day (TID) | ORAL | 0 refills | Status: DC | PRN

## 2016-01-29 MED ORDER — SULFAMETHOXAZOLE-TRIMETHOPRIM 800-160 MG PO TABS: 1.0000 | ORAL_TABLET | Freq: Two times a day (BID) | ORAL | 0 refills | Status: DC

## 2016-01-29 MED ORDER — HYDROCODONE-ACETAMINOPHEN 5-325 MG PO TABS: 1.0000 | ORAL_TABLET | ORAL | 0 refills | Status: DC | PRN

## 2016-01-29 NOTE — Discharge Instructions (Signed)

## 2016-01-29 NOTE — Progress Notes (Signed)
  POD#1 [facility], TE/ADM, SLN  Temp:  [97.9 F (36.6 C)-98.9 F (37.2 C)] 98 F (36.7 C) (10/11 0620) Pulse Rate:  [67-95] 67 (10/11 0620) Resp:  [12-29] 16 (10/11 0620) BP: (99-136)/(54-92) 99/59 (10/11 0620) SpO2:  [94 %-100 %] 97 % (10/11 0620)   JP 310 ml  Doing well, pain controlled  PE: chest flat soft, incisions dry, Tegaderm in place  A/P home today. Reviewed drain care F/u scheduled  Irene Limbo, MD [facility] Plastic & Reconstructive Surgery [phone], pin [phone]

## 2016-01-30 ENCOUNTER — Encounter (HOSPITAL_BASED_OUTPATIENT_CLINIC_OR_DEPARTMENT_OTHER): Payer: Self-pay | Admitting: General Surgery

## 2016-01-31 NOTE — Progress Notes (Signed)
Please let patient know margins and lymph nodes are negative. Also, no invasive cancer was seen.

## 2016-02-12 ENCOUNTER — Ambulatory Visit (HOSPITAL_BASED_OUTPATIENT_CLINIC_OR_DEPARTMENT_OTHER): Payer: Managed Care, Other (non HMO) | Admitting: Oncology

## 2016-02-12 VITALS — BP 137/65 | HR 84 | Temp 98.5°F | Resp 18 | Ht 63.0 in | Wt 166.9 lb

## 2016-02-12 DIAGNOSIS — Z170 Estrogen receptor positive status [ER+]: Secondary | ICD-10-CM | POA: Diagnosis not present

## 2016-02-12 DIAGNOSIS — D0512 Intraductal carcinoma in situ of left breast: Secondary | ICD-10-CM | POA: Diagnosis not present

## 2016-02-12 DIAGNOSIS — C50412 Malignant neoplasm of upper-outer quadrant of left female breast: Secondary | ICD-10-CM

## 2016-02-12 MED ORDER — HYDROCODONE-ACETAMINOPHEN 5-325 MG PO TABS: 1.0000 | ORAL_TABLET | ORAL | 0 refills | Status: DC | PRN

## 2016-02-12 NOTE — Progress Notes (Signed)
Tammy Gray  [phone] [phone]) [phone]     ID: Tammy Gray DOB: 13-Nov-1965  MR#: [id number]  [id number]:[id number]  Patient Care Team: Vania Rea, MD as PCP - General (Obstetrics and Gynecology) Stark Klein, MD as Consulting Physician (General Surgery) Chauncey Cruel, MD as Consulting Physician (Oncology) Kyung Rudd, MD as Consulting Physician (Radiation Oncology) Danella Sensing, MD as Consulting Physician (Dermatology) Irene Limbo, MD as Consulting Physician (Plastic Surgery) OTHER MD:  CHIEF COMPLAINT: Ductal carcinoma in situ  CURRENT TREATMENT:    BREAST CANCER HISTORY: From the original intake note:  Tammy Gray had routine screening mammography at [place] 12/05/2015. There were calcifications in the left breast which were felt to warrant further evaluation. Accordingly on 12/16/2015 she underwent left diagnostic mammography at the [facility]. This showed the breast density to be category C. In the lateral aspect of the left breast there was an area of 3 mm of course calcifications and 4 cm lateral to this there is an area of developing punctate calcifications measuring 4.7 cm.  Biopsy of these 2 areas was performed 12/20/2015. Both showed high-grade ductal carcinoma in situ, both were estrogen receptor positive (80-90%) and progesterone receptor positive (5-90%), all these being with moderate staining intensity.  Her subsequent history is as detailed below  INTERVAL HISTORY: Tammy Gray returns today for follow-up of her ductal carcinoma in situ. Since her last visit here she underwent left mastectomy with sentinel lymph node sampling, on 01/28/2016. The final pathology ([id number]) confirmed ductal carcinoma in situ, high-grade, within 0.1 cm of the anterior margin focally. There was no evidence of invasive carcinoma. All 4 sentinel lymph nodes were clear. She underwent expander placement through the same procedure.  Her case was  also presented at the multidisciplinary breast cancer conference 02/11/2017. At that time it was felt the close margin was anterior and therefore did not require further investigation or adjuvant radiation.  REVIEW OF SYSTEMS: She did well with the surgery, with no unusual fever, bleeding, or other complications. She still has a drain in place. She does have pain, as expected. There is some burning sensation in the medial aspect of the left upper extremity, but no lymphedema. She is taking hydrocodone about every 5 hours. This is causing her to be slightly nauseated was that she takes it with food. It is also constipating her. She is using MiraLAX and is able to have a bowel movement daily, although it is a little bit firmer than she would like. Of course her activity level is very restricted at this point. A detailed review of systems is otherwise stable.   PAST MEDICAL HISTORY: Past Medical History:  Diagnosis Date  . Breast cancer of upper-outer quadrant of left female breast ([place]) 12/25/2015  . Herpes 1987  . Hypertension   . IBS (irritable bowel syndrome)     PAST SURGICAL HISTORY: Past Surgical History:  Procedure Laterality Date  . BREAST RECONSTRUCTION WITH PLACEMENT OF TISSUE EXPANDER AND FLEX HD (ACELLULAR HYDRATED DERMIS) Left 01/28/2016   Procedure: LEFT BREAST RECONSTRUCTION WITH PLACEMENT OF TISSUE EXPANDER AND CORTIVA;  Surgeon: Irene Limbo, MD;  Location: [facility];  Service: Plastics;  Laterality: Left;  . CESAREAN SECTION    . MASTECTOMY W/ SENTINEL NODE BIOPSY Left 01/28/2016   Procedure: LEFT MASTECTOMY WITH SENTINEL LYMPH NODE BIOPSY;  Surgeon: Stark Klein, MD;  Location: [place];  Service: General;  Laterality: Left;    FAMILY HISTORY Family History  Problem Relation Age  of Onset  . Other Mother 32    hx of hysterectomy to treat endometriosis and heavy bleeding  . Prostate cancer Father 86    unspecified Gleason score;  +prostatectomy  . Colon cancer Paternal Grandmother     dx late 64s; w/ mets, d. 11  . Colon cancer Paternal Grandfather     dx. late 51s  . Stroke Maternal Grandmother   . Diverticulitis Maternal Grandmother   . Hodgkin's lymphoma Cousin     maternal 1st cousin dx. in his 20s-30s  . Breast cancer Cousin     paternal 1st cousin, once-removed (on PGF's side of family) dx late 51s  The patient's father is 25 as of September 2017. He has a history of prostate cancer diagnosed age 52. The patient's mother is also alive, also 51 as of September 2017. On the paternal side a grandfather had colon cancer and a grandmother also colon cancer. There is no history of breast or ovarian cancer in the family to the patient's knowledge.  GYNECOLOGIC HISTORY:  Patient's last menstrual period was 12/31/2015 (exact date). She still having regular periods, and was on oral contraceptives until the time of this diagnosis, when this was stopped.  SOCIAL HISTORY: (updated 01/01/2016) Tammy Gray works as an Photographer. She is divorced. At home are her 2 sons [name], age 53, studying business at [employer], and [name], 17, currently in high school    ADVANCED DIRECTIVES: Not in place. At the 01/01/2016 visit the patient was given the appropriate documents to complete and notarize at her discretion    HEALTH MAINTENANCE: Social History  Substance Use Topics  . Smoking status: Never Smoker  . Smokeless tobacco: Never Used  . Alcohol use No     Colonoscopy:Never  PAP: Up-to-date  Bone density: Never   No Known Allergies  Current Outpatient Prescriptions  Medication Sig Dispense Refill  . dicyclomine (BENTYL) 10 MG capsule Take 10 mg by mouth as needed for spasms.    . hydrochlorothiazide (MICROZIDE) 12.5 MG capsule Take 12.5 mg by mouth daily.    Marland Kitchen HYDROcodone-acetaminophen (NORCO/VICODIN) 5-325 MG tablet Take 1-2 tablets by mouth every 4 (four) hours as needed for moderate pain. 50 tablet 0  . lisinopril  (PRINIVIL,ZESTRIL) 5 MG tablet Take 5 mg by mouth daily.    Marland Kitchen loratadine (CLARITIN) 10 MG tablet Take 10 mg by mouth daily.    . methocarbamol (ROBAXIN) 500 MG tablet Take 1 tablet (500 mg total) by mouth every 8 (eight) hours as needed for muscle spasms. 30 tablet 0  . Multiple Vitamins-Minerals (WOMENS MULTIVITAMIN PO) Take 1 tablet by mouth daily.    . tamoxifen (NOLVADEX) 20 MG tablet Take 1 tablet (20 mg total) by mouth daily. 90 tablet 3  . valACYclovir (VALTREX) 500 MG tablet Take 500 mg by mouth daily.     No current facility-administered medications for this visit.     OBJECTIVE: Middle-aged white woman In no acute distress Vitals:   02/12/16 1445  BP: 137/65  Pulse: 84  Resp: 18  Temp: 98.5 F (36.9 C)     Body mass index is 29.57 kg/m.    ECOG FS:1 - Symptomatic but completely ambulatory  Sclerae unicteric, pupils round and equal Oropharynx clear and moist-- no thrush or other lesions No cervical or supraclavicular adenopathy Lungs no rales or rhonchi Heart regular rate and rhythm Abd soft, nontender, positive bowel sounds MSK no focal spinal tenderness, no upper extremity lymphedema Neuro: nonfocal, well oriented, appropriate affect Breasts:  The right breast is benign. The left breast is status post mastectomy with expander in place. There is no dehiscence, erythema, or swelling. The left axilla is benign.   RESULTS:  CMP     Component Value Date/Time   NA 139 01/01/2016 1213   K 3.6 01/01/2016 1213   CO2 25 01/01/2016 1213   GLUCOSE 120 01/01/2016 1213   BUN 18.1 01/01/2016 1213   CREATININE 0.8 01/01/2016 1213   CALCIUM 9.5 01/01/2016 1213   PROT 7.6 01/01/2016 1213   ALBUMIN 4.0 01/01/2016 1213   AST 19 01/01/2016 1213   ALT 32 01/01/2016 1213   ALKPHOS 22 (L) 01/01/2016 1213   BILITOT 0.39 01/01/2016 1213    INo results found for: SPEP, UPEP  Lab Results  Component Value Date   WBC 5.9 01/01/2016   NEUTROABS 3.3 01/01/2016   HGB 14.8 01/01/2016    HCT 44.6 01/01/2016   MCV 85.6 01/01/2016   PLT 258 01/01/2016      Chemistry      Component Value Date/Time   NA 139 01/01/2016 1213   K 3.6 01/01/2016 1213   CO2 25 01/01/2016 1213   BUN 18.1 01/01/2016 1213   CREATININE 0.8 01/01/2016 1213      Component Value Date/Time   CALCIUM 9.5 01/01/2016 1213   ALKPHOS 22 (L) 01/01/2016 1213   AST 19 01/01/2016 1213   ALT 32 01/01/2016 1213   BILITOT 0.39 01/01/2016 1213       No results found for: LABCA2  No components found for: LABCA125  No results for input(s): INR in the last 168 hours.  Urinalysis No results found for: COLORURINE, APPEARANCEUR, LABSPEC, PHURINE, GLUCOSEU, HGBUR, BILIRUBINUR, KETONESUR, PROTEINUR, UROBILINOGEN, NITRITE, LEUKOCYTESUR   STUDIES: Nm Sentinel Node Inj-no Rpt (breast)  Result Date: 01/28/2016 CLINICAL DATA: left breast cancer Sulfur colloid was injected intradermally by the nuclear medicine technologist for breast cancer sentinel node localization.    ELIGIBLE FOR AVAILABLE RESEARCH PROTOCOL: no  ASSESSMENT: 50 y.o. [place] woman, status post left breast upper outer quadrant biopsy 12/20/2015 for ductal carcinoma in situ, grade 3, estrogen and progesterone receptor positive.  (1) genetics testing 01/07/2016-- results pending  (2) status post left mastectomy and sentinel lymph node sampling 01/28/2016 for a pTis pN0, stage 0 ductal carcinoma in situ, grade 3, with close but negative margins.   (3) no need for adjuvant radiation per discussion at multidisciplinary conference 02/12/2016  4) tamoxifen started 01/01/2016   PLAN: Today we reviewed Tammy Gray's surgical results which are excellent. I explained what a focal close margin means and also the fact that this margin was anterior and therefore skin and therefore not a concern. She does not need adjuvant radiation.  She continues on tamoxifen, and she has excellent tolerance. She has no side effects from it that  she is aware of. We discussed the fact that her risk of developing another cancer in the remaining breast for her lifetime (assuming no genetic mutation) isn't a 20% range. Tamoxifen would cut that in half. That is his sufficient motivator for her at this point. Accordingly the plan is to continue tamoxifen for total of 5 years unless she develops side effects or complications from  She is working very closely with her Psychiatric nurse and irregular surgeon noted accordingly she will return to see me in March. If all is going well she will start see me on a yearly basis from that time.  For her convenience today I refilled her hydrocodone prescription,  giving her an additional 50 tablets, with no refills.   Chauncey Cruel, MD   02/12/2016 3:12 PM Medical Oncology and Hematology [facility] [address] [place], [place] [address] Tel. [phone]    Fax. [phone]

## 2016-03-18 NOTE — H&P (Signed)
Subjective:     Patient ID: Tammy Gray is a 50 y.o. female.  HPI  7 weeks post op.    Presented following screening MMG with calcification left breast.  In the lateral aspect of the left breast there is an area of 3 mm of course calcifications and 4 cm lateral to this there is an area of developing punctate calcifications measuring 4.7 cm. Biopsy of both areas DCIS, ER/PR +. Mastectomy completed given span of disease. Final pathology high grade DCIS, 0/4 SLN, margins close but negative. No need for adjuvant radiation per discussion at multidisciplinary conference.  Genetics-patient declined testing . Tamoxifen started neoadjuvantly .  Prior 36/38 D, happy with this. Left mastectomy 687 g   Review of Systems    Objective:   Physical Exam  Cardiovascular: Normal rate, regular rhythm and normal heart sounds.   Pulmonary/Chest: Effort normal and breath sounds normal.  Abdominal: Soft.   Grade 2 ptosis right SN to nipple R 27 cm BW R 15 L 14 cm Nipple to IMF R 9 cm   Assessment:     Left breast DCIS s/p SSM, SLN, TE/ADM reconstruction    Plan:     Plan second stage with implant exchange on left, right mastopexy, lipofilling to left chest.  Reviewed risks infection, capsular contracture, rippling, MRI surveillance to detect rupture silicone. Reviewed purpose fat grafting to aid with contour thicken flaps. Reviewed donor site incisions, pain, bruising, risk fat necrosis, variable take graft, need to repeat, donor site contour abnormalities Patient has decided onsaline implants, plan smooth round.  Reviewed changes with aging,wt loss/gain and need for revision. Discussed right mastopexy lollipop or anchor type scar, do not anticipate drain on this side.  We reviewed with unilateral implant reconstruction, will have asymmetry shape when not in bra, counseled even with mastopexy will not have as much fullness superior pole. Could address this with augmentation on right;  however over time her overlying breast tissue will still develop ptosis. She overall is happy with right breast volume so will defer any additional surgery on right; counseled can always elect for augmentation on this side in future if desired.  Plan OP surgery. Provided Rx for Bactrim and Norco today.Reviewed abdominal compression for 6 weeks pos op.   Natrelle 133MX-12-T 400 ml tissue expander placed,  fill volume 450 ml    Irene Limbo, MD [facility] Plastic & Reconstructive Surgery [phone], pin [phone]

## 2016-04-07 ENCOUNTER — Encounter (HOSPITAL_BASED_OUTPATIENT_CLINIC_OR_DEPARTMENT_OTHER)
Admission: RE | Admit: 2016-04-07 | Discharge: 2016-04-07 | Disposition: A | Discharge status: Home / Self Care | Payer: Managed Care, Other (non HMO) | Source: Ambulatory Visit | Attending: Plastic Surgery | Admitting: Plastic Surgery

## 2016-04-07 ENCOUNTER — Encounter (HOSPITAL_BASED_OUTPATIENT_CLINIC_OR_DEPARTMENT_OTHER): Payer: Self-pay | Admitting: *Deleted

## 2016-04-07 DIAGNOSIS — I10 Essential (primary) hypertension: Secondary | ICD-10-CM | POA: Diagnosis not present

## 2016-04-07 DIAGNOSIS — Z853 Personal history of malignant neoplasm of breast: Secondary | ICD-10-CM | POA: Diagnosis not present

## 2016-04-07 DIAGNOSIS — N651 Disproportion of reconstructed breast: Secondary | ICD-10-CM | POA: Diagnosis present

## 2016-04-07 DIAGNOSIS — Z9012 Acquired absence of left breast and nipple: Secondary | ICD-10-CM | POA: Diagnosis not present

## 2016-04-07 LAB — BASIC METABOLIC PANEL
Anion gap: 8 (ref 5–15)
BUN: 10 mg/dL (ref 6–20)
CHLORIDE: 104 mmol/L (ref 101–111)
CO2: 26 mmol/L (ref 22–32)
CREATININE: 0.7 mg/dL (ref 0.44–1.00)
Calcium: 9.4 mg/dL (ref 8.9–10.3)
Glucose, Bld: 81 mg/dL (ref 65–99)
POTASSIUM: 3.8 mmol/L (ref 3.5–5.1)
SODIUM: 138 mmol/L (ref 135–145)

## 2016-04-07 NOTE — Progress Notes (Signed)
Pt given 8 oz carton of boost breeze with written instruction and verbal, to drink by 1000 morning of surgery, teach back , pt voiced understanding

## 2016-04-10 ENCOUNTER — Ambulatory Visit (HOSPITAL_BASED_OUTPATIENT_CLINIC_OR_DEPARTMENT_OTHER): Payer: Managed Care, Other (non HMO) | Admitting: Anesthesiology

## 2016-04-10 ENCOUNTER — Ambulatory Visit (HOSPITAL_BASED_OUTPATIENT_CLINIC_OR_DEPARTMENT_OTHER)
Admission: RE | Admit: 2016-04-10 | Discharge: 2016-04-10 | Disposition: A | Discharge status: Home / Self Care | Payer: Managed Care, Other (non HMO) | Source: Ambulatory Visit | Attending: Plastic Surgery | Admitting: Plastic Surgery

## 2016-04-10 ENCOUNTER — Encounter (HOSPITAL_BASED_OUTPATIENT_CLINIC_OR_DEPARTMENT_OTHER): Payer: Self-pay

## 2016-04-10 ENCOUNTER — Encounter (HOSPITAL_BASED_OUTPATIENT_CLINIC_OR_DEPARTMENT_OTHER)
Admission: RE | Disposition: A | Discharge status: Home / Self Care | Payer: Self-pay | Source: Ambulatory Visit | Attending: Plastic Surgery

## 2016-04-10 DIAGNOSIS — N651 Disproportion of reconstructed breast: Secondary | ICD-10-CM | POA: Diagnosis not present

## 2016-04-10 DIAGNOSIS — I10 Essential (primary) hypertension: Secondary | ICD-10-CM | POA: Insufficient documentation

## 2016-04-10 DIAGNOSIS — Z853 Personal history of malignant neoplasm of breast: Secondary | ICD-10-CM | POA: Insufficient documentation

## 2016-04-10 DIAGNOSIS — Z9012 Acquired absence of left breast and nipple: Secondary | ICD-10-CM | POA: Insufficient documentation

## 2016-04-10 HISTORY — PX: MASTOPEXY: SHX5358

## 2016-04-10 HISTORY — PX: REMOVAL OF TISSUE EXPANDER AND PLACEMENT OF IMPLANT: SHX6457

## 2016-04-10 HISTORY — PX: LIPOSUCTION WITH LIPOFILLING: SHX6436

## 2016-04-10 SURGERY — REMOVAL, TISSUE EXPANDER, BREAST, WITH IMPLANT INSERTION
Anesthesia: General | Site: Breast | Laterality: Right

## 2016-04-10 MED ORDER — CELECOXIB 200 MG PO CAPS
ORAL_CAPSULE | ORAL | Status: AC
  Filled 2016-04-10: qty 2

## 2016-04-10 MED ORDER — ACETAMINOPHEN 500 MG PO TABS
1000.0000 mg | ORAL_TABLET | ORAL | Status: AC
  Administered 2016-04-10: 1000 mg via ORAL

## 2016-04-10 MED ORDER — LIDOCAINE HCL (PF) 1 % IJ SOLN
INTRAMUSCULAR | Status: AC
  Filled 2016-04-10: qty 60

## 2016-04-10 MED ORDER — LIDOCAINE 2% (20 MG/ML) 5 ML SYRINGE
INTRAMUSCULAR | Status: AC
  Filled 2016-04-10: qty 5

## 2016-04-10 MED ORDER — KETOROLAC TROMETHAMINE 30 MG/ML IJ SOLN
INTRAMUSCULAR | Status: AC
  Filled 2016-04-10: qty 1

## 2016-04-10 MED ORDER — METOCLOPRAMIDE HCL 5 MG/ML IJ SOLN: 10.0000 mg | Freq: Once | INTRAMUSCULAR | Status: DC | PRN

## 2016-04-10 MED ORDER — HYDROMORPHONE HCL 1 MG/ML IJ SOLN
INTRAMUSCULAR | Status: AC
  Filled 2016-04-10: qty 1

## 2016-04-10 MED ORDER — FENTANYL CITRATE (PF) 100 MCG/2ML IJ SOLN
50.0000 ug | INTRAMUSCULAR | Status: AC | PRN
  Administered 2016-04-10 (×3): 50 ug via INTRAVENOUS
  Administered 2016-04-10: 100 ug via INTRAVENOUS
  Administered 2016-04-10: 50 ug via INTRAVENOUS

## 2016-04-10 MED ORDER — ACETAMINOPHEN 500 MG PO TABS
ORAL_TABLET | ORAL | Status: AC
  Filled 2016-04-10: qty 2

## 2016-04-10 MED ORDER — SODIUM BICARBONATE 4 % IV SOLN
INTRAVENOUS | Status: AC
  Filled 2016-04-10: qty 10

## 2016-04-10 MED ORDER — SCOPOLAMINE 1 MG/3DAYS TD PT72: 1.0000 | MEDICATED_PATCH | TRANSDERMAL | Status: DC

## 2016-04-10 MED ORDER — ONDANSETRON HCL 4 MG/2ML IJ SOLN
INTRAMUSCULAR | Status: AC
  Filled 2016-04-10: qty 2

## 2016-04-10 MED ORDER — GABAPENTIN 300 MG PO CAPS
ORAL_CAPSULE | ORAL | Status: AC
  Filled 2016-04-10: qty 1

## 2016-04-10 MED ORDER — HYDROMORPHONE HCL 1 MG/ML IJ SOLN
0.2500 mg | INTRAMUSCULAR | Status: DC | PRN
  Administered 2016-04-10 (×3): 0.5 mg via INTRAVENOUS

## 2016-04-10 MED ORDER — SODIUM CHLORIDE 0.9 % IV SOLN
INTRAVENOUS | Status: DC | PRN
  Administered 2016-04-10: 1000 mL

## 2016-04-10 MED ORDER — SUGAMMADEX SODIUM 200 MG/2ML IV SOLN
INTRAVENOUS | Status: DC | PRN
  Administered 2016-04-10: 200 mg via INTRAVENOUS

## 2016-04-10 MED ORDER — MIDAZOLAM HCL 2 MG/2ML IJ SOLN
INTRAMUSCULAR | Status: AC
  Filled 2016-04-10: qty 2

## 2016-04-10 MED ORDER — SCOPOLAMINE 1 MG/3DAYS TD PT72
1.0000 | MEDICATED_PATCH | Freq: Once | TRANSDERMAL | Status: DC | PRN
  Administered 2016-04-10: 1.5 mg via TRANSDERMAL

## 2016-04-10 MED ORDER — DEXAMETHASONE SODIUM PHOSPHATE 4 MG/ML IJ SOLN
INTRAMUSCULAR | Status: DC | PRN
  Administered 2016-04-10: 10 mg via INTRAVENOUS

## 2016-04-10 MED ORDER — PROPOFOL 10 MG/ML IV BOLUS
INTRAVENOUS | Status: DC | PRN
  Administered 2016-04-10: 50 mg via INTRAVENOUS
  Administered 2016-04-10: 150 mg via INTRAVENOUS

## 2016-04-10 MED ORDER — CEFAZOLIN SODIUM-DEXTROSE 2-4 GM/100ML-% IV SOLN
2.0000 g | INTRAVENOUS | Status: AC
  Administered 2016-04-10: 2 g via INTRAVENOUS

## 2016-04-10 MED ORDER — KETOROLAC TROMETHAMINE 30 MG/ML IJ SOLN
30.0000 mg | Freq: Once | INTRAMUSCULAR | Status: AC
  Administered 2016-04-10: 30 mg via INTRAVENOUS

## 2016-04-10 MED ORDER — GABAPENTIN 300 MG PO CAPS
300.0000 mg | ORAL_CAPSULE | ORAL | Status: AC
  Administered 2016-04-10: 300 mg via ORAL

## 2016-04-10 MED ORDER — SODIUM BICARBONATE 4 % IV SOLN
INTRAVENOUS | Status: DC | PRN
  Administered 2016-04-10: 450 mL via INTRAMUSCULAR

## 2016-04-10 MED ORDER — CHLORHEXIDINE GLUCONATE CLOTH 2 % EX PADS: 6.0000 | MEDICATED_PAD | Freq: Once | CUTANEOUS | Status: DC

## 2016-04-10 MED ORDER — HYDROCODONE-ACETAMINOPHEN 7.5-325 MG PO TABS
1.0000 | ORAL_TABLET | Freq: Once | ORAL | Status: DC | PRN
Start: 2016-04-10 — End: 2016-04-10

## 2016-04-10 MED ORDER — SCOPOLAMINE 1 MG/3DAYS TD PT72
MEDICATED_PATCH | TRANSDERMAL | Status: AC
  Filled 2016-04-10: qty 1

## 2016-04-10 MED ORDER — LIDOCAINE HCL (CARDIAC) 20 MG/ML IV SOLN
INTRAVENOUS | Status: DC | PRN
  Administered 2016-04-10: 50 mg via INTRAVENOUS

## 2016-04-10 MED ORDER — MIDAZOLAM HCL 2 MG/2ML IJ SOLN
1.0000 mg | INTRAMUSCULAR | Status: DC | PRN
  Administered 2016-04-10: 2 mg via INTRAVENOUS

## 2016-04-10 MED ORDER — LACTATED RINGERS IV SOLN
INTRAVENOUS | Status: DC
  Administered 2016-04-10: 11:00:00 via INTRAVENOUS

## 2016-04-10 MED ORDER — ROCURONIUM BROMIDE 100 MG/10ML IV SOLN
INTRAVENOUS | Status: DC | PRN
  Administered 2016-04-10: 10 mg via INTRAVENOUS
  Administered 2016-04-10: 50 mg via INTRAVENOUS

## 2016-04-10 MED ORDER — FENTANYL CITRATE (PF) 100 MCG/2ML IJ SOLN
INTRAMUSCULAR | Status: AC
  Filled 2016-04-10: qty 2

## 2016-04-10 MED ORDER — ROCURONIUM BROMIDE 10 MG/ML (PF) SYRINGE
PREFILLED_SYRINGE | INTRAVENOUS | Status: AC
Start: 2016-04-10 — End: 2016-04-10
  Filled 2016-04-10: qty 10

## 2016-04-10 MED ORDER — DEXAMETHASONE SODIUM PHOSPHATE 10 MG/ML IJ SOLN
INTRAMUSCULAR | Status: AC
  Filled 2016-04-10: qty 1

## 2016-04-10 MED ORDER — CELECOXIB 400 MG PO CAPS
400.0000 mg | ORAL_CAPSULE | ORAL | Status: AC
  Administered 2016-04-10: 400 mg via ORAL

## 2016-04-10 MED ORDER — MEPERIDINE HCL 25 MG/ML IJ SOLN: 6.2500 mg | INTRAMUSCULAR | Status: DC | PRN

## 2016-04-10 MED ORDER — PROPOFOL 10 MG/ML IV BOLUS
INTRAVENOUS | Status: AC
  Filled 2016-04-10: qty 20

## 2016-04-10 MED ORDER — SUGAMMADEX SODIUM 200 MG/2ML IV SOLN
INTRAVENOUS | Status: AC
  Filled 2016-04-10: qty 2

## 2016-04-10 MED ORDER — PHENYLEPHRINE HCL 10 MG/ML IJ SOLN
INTRAMUSCULAR | Status: DC | PRN
  Administered 2016-04-10: 50 ug/min via INTRAVENOUS

## 2016-04-10 SURGICAL SUPPLY — 92 items
BAG DECANTER FOR FLEXI CONT (MISCELLANEOUS) ×4 IMPLANT
BINDER ABDOMINAL 10 UNV 27-48 (MISCELLANEOUS) ×4 IMPLANT
BINDER ABDOMINAL 12 SM 30-45 (SOFTGOODS) IMPLANT
BINDER BREAST LRG (GAUZE/BANDAGES/DRESSINGS) IMPLANT
BINDER BREAST MEDIUM (GAUZE/BANDAGES/DRESSINGS) IMPLANT
BINDER BREAST XLRG (GAUZE/BANDAGES/DRESSINGS) ×4 IMPLANT
BINDER BREAST XXLRG (GAUZE/BANDAGES/DRESSINGS) IMPLANT
BLADE SURG 10 STRL SS (BLADE) ×4 IMPLANT
BLADE SURG 11 STRL SS (BLADE) ×4 IMPLANT
BLADE SURG 15 STRL LF DISP TIS (BLADE) IMPLANT
BLADE SURG 15 STRL SS (BLADE)
BNDG GAUZE ELAST 4 BULKY (GAUZE/BANDAGES/DRESSINGS) ×8 IMPLANT
CANISTER LIPO FAT HARVEST (MISCELLANEOUS) ×4 IMPLANT
CANISTER SUCT 1200ML W/VALVE (MISCELLANEOUS) ×4 IMPLANT
CHLORAPREP W/TINT 26ML (MISCELLANEOUS) ×4 IMPLANT
COMPRESSION GARMENT LG MOREWEL (MISCELLANEOUS) IMPLANT
COMPRESSION GARMENT MD MOREWEL (MISCELLANEOUS) IMPLANT
COMPRESSION GARMENT XL MOREWEL (MISCELLANEOUS) IMPLANT
COVER BACK TABLE 60X90IN (DRAPES) ×4 IMPLANT
COVER MAYO STAND STRL (DRAPES) ×4 IMPLANT
DECANTER SPIKE VIAL GLASS SM (MISCELLANEOUS) IMPLANT
DRAIN CHANNEL 15F RND FF W/TCR (WOUND CARE) ×4 IMPLANT
DRAIN CHANNEL 19F RND (DRAIN) IMPLANT
DRAPE TOP ARMCOVERS (MISCELLANEOUS) ×4 IMPLANT
DRAPE TOWEL 19X300 DH TIB (DRAPES) ×4 IMPLANT
DRAPE U-SHAPE 76X120 STRL (DRAPES) ×4 IMPLANT
DRSG PAD ABDOMINAL 8X10 ST (GAUZE/BANDAGES/DRESSINGS) ×16 IMPLANT
DRSG TEGADERM 2-3/8X2-3/4 SM (GAUZE/BANDAGES/DRESSINGS) IMPLANT
ELECT BLADE 4.0 EZ CLEAN MEGAD (MISCELLANEOUS) ×4
ELECT COATED BLADE 2.86 ST (ELECTRODE) ×4 IMPLANT
ELECT REM PT RETURN 9FT ADLT (ELECTROSURGICAL) ×4
ELECTRODE BLDE 4.0 EZ CLN MEGD (MISCELLANEOUS) ×3 IMPLANT
ELECTRODE REM PT RTRN 9FT ADLT (ELECTROSURGICAL) ×3 IMPLANT
EVACUATOR SILICONE 100CC (DRAIN) ×4 IMPLANT
FILTER LIPOSUCTION (MISCELLANEOUS) ×4 IMPLANT
GAUZE SPONGE 4X4 12PLY STRL (GAUZE/BANDAGES/DRESSINGS) IMPLANT
GLOVE BIO SURGEON STRL SZ 6 (GLOVE) ×8 IMPLANT
GLOVE BIOGEL M STRL SZ7.5 (GLOVE) ×4 IMPLANT
GLOVE BIOGEL PI IND STRL 7.0 (GLOVE) ×12 IMPLANT
GLOVE BIOGEL PI IND STRL 8 (GLOVE) ×3 IMPLANT
GLOVE BIOGEL PI INDICATOR 7.0 (GLOVE) ×4
GLOVE BIOGEL PI INDICATOR 8 (GLOVE) ×1
GLOVE ECLIPSE 6.5 STRL STRAW (GLOVE) ×16 IMPLANT
GOWN STRL REUS W/ TWL LRG LVL3 (GOWN DISPOSABLE) ×6 IMPLANT
GOWN STRL REUS W/ TWL XL LVL3 (GOWN DISPOSABLE) ×3 IMPLANT
GOWN STRL REUS W/TWL LRG LVL3 (GOWN DISPOSABLE) ×2
GOWN STRL REUS W/TWL XL LVL3 (GOWN DISPOSABLE) ×1
IMPL BREAST SALINE 650-700C (Breast) ×3 IMPLANT
IMPLANT BREAST SALINE 650-700C (Breast) ×4 IMPLANT
IV NS 500ML (IV SOLUTION) ×1
IV NS 500ML BAXH (IV SOLUTION) ×3 IMPLANT
KIT FILL SYSTEM UNIVERSAL (SET/KITS/TRAYS/PACK) IMPLANT
LINER CANISTER 1000CC FLEX (MISCELLANEOUS) ×4 IMPLANT
LIQUID BAND (GAUZE/BANDAGES/DRESSINGS) ×8 IMPLANT
NDL SAFETY ECLIPSE 18X1.5 (NEEDLE) ×3 IMPLANT
NEEDLE HYPO 18GX1.5 SHARP (NEEDLE) ×1
NEEDLE HYPO 25X1 1.5 SAFETY (NEEDLE) IMPLANT
NS IRRIG 1000ML POUR BTL (IV SOLUTION) ×8 IMPLANT
PACK BASIN DAY SURGERY FS (CUSTOM PROCEDURE TRAY) ×4 IMPLANT
PACK UNIVERSAL I (CUSTOM PROCEDURE TRAY) IMPLANT
PAD ALCOHOL SWAB (MISCELLANEOUS) ×4 IMPLANT
PENCIL BUTTON HOLSTER BLD 10FT (ELECTRODE) ×4 IMPLANT
PIN SAFETY STERILE (MISCELLANEOUS) ×4 IMPLANT
SHEET MEDIUM DRAPE 40X70 STRL (DRAPES) ×8 IMPLANT
SIZER BREAST SGL USE HP 650CC (SIZER) ×4
SIZER BRST SGL USE HP 650CC (SIZER) ×3 IMPLANT
SLEEVE SCD COMPRESS KNEE MED (MISCELLANEOUS) ×4 IMPLANT
SLEEVE SURGEON STRL (DRAPES) ×4 IMPLANT
SPONGE GAUZE 4X4 12PLY STER LF (GAUZE/BANDAGES/DRESSINGS) IMPLANT
SPONGE LAP 18X18 X RAY DECT (DISPOSABLE) ×8 IMPLANT
STAPLER VISISTAT 35W (STAPLE) ×4 IMPLANT
STRIP CLOSURE SKIN 1/2X4 (GAUZE/BANDAGES/DRESSINGS) IMPLANT
SUT ETHILON 2 0 FS 18 (SUTURE) ×4 IMPLANT
SUT MNCRL AB 4-0 PS2 18 (SUTURE) ×8 IMPLANT
SUT PDS AB 2-0 CT2 27 (SUTURE) ×8 IMPLANT
SUT VIC AB 3-0 PS1 18 (SUTURE) ×2
SUT VIC AB 3-0 PS1 18XBRD (SUTURE) ×6 IMPLANT
SUT VIC AB 3-0 SH 27 (SUTURE) ×1
SUT VIC AB 3-0 SH 27X BRD (SUTURE) ×3 IMPLANT
SUT VICRYL 4-0 PS2 18IN ABS (SUTURE) ×4 IMPLANT
SYR 10ML LL (SYRINGE) ×12 IMPLANT
SYR 50ML LL SCALE MARK (SYRINGE) ×16 IMPLANT
SYR BULB IRRIGATION 50ML (SYRINGE) ×8 IMPLANT
SYR CONTROL 10ML LL (SYRINGE) IMPLANT
SYR TB 1ML LL NO SAFETY (SYRINGE) ×4 IMPLANT
TAPE MEASURE VINYL STERILE (MISCELLANEOUS) ×4 IMPLANT
TOWEL OR 17X24 6PK STRL BLUE (TOWEL DISPOSABLE) ×12 IMPLANT
TUBE CONNECTING 20X1/4 (TUBING) ×8 IMPLANT
TUBING INFILTRATION IT-10001 (TUBING) ×4 IMPLANT
TUBING SET GRADUATE ASPIR 12FT (MISCELLANEOUS) ×4 IMPLANT
UNDERPAD 30X30 (UNDERPADS AND DIAPERS) ×8 IMPLANT
YANKAUER SUCT BULB TIP NO VENT (SUCTIONS) ×4 IMPLANT

## 2016-04-10 NOTE — Interval H&P Note (Signed)
History and Physical Interval Note:  04/10/2016 10:41 AM  Tammy Gray  has presented today for surgery, with the diagnosis of acquired absent breast hx of breast cancer asymmetry native and reconstructive breast  The various methods of treatment have been discussed with the patient and family. After consideration of risks, benefits and other options for treatment, the patient has consented to  Procedure(s): REMOVAL OF TISSUE EXPANDER AND PLACEMENT OF IMPLANT (Left) MASTOPEXY (Right) LIPOFILLING from abdomen to left chest (Left) as a surgical intervention .  The patient's history has been reviewed, patient examined, no change in status, stable for surgery.  I have reviewed the patient's chart and labs.  Questions were answered to the patient's satisfaction.     Eather Chaires

## 2016-04-10 NOTE — Transfer of Care (Signed)
Immediate Anesthesia Transfer of Care Note  Patient: Tammy Gray  Procedure(s) Performed: Procedure(s): REMOVAL OF TISSUE EXPANDER AND PLACEMENT OF IMPLANT (Left) MASTOPEXY (Right) LIPOFILLING from abdomen to left chest (Left)  Patient Location: PACU  Anesthesia Type:General  Level of Consciousness: awake and sedated  Airway & Oxygen Therapy: Patient Spontanous Breathing and Patient connected to face mask oxygen  Post-op Assessment: Report given to RN and Post -op Vital signs reviewed and stable  Post vital signs: Reviewed and stable  Last Vitals:  Vitals:   04/10/16 1043  BP: (!) 124/56  Resp: 18  Temp: 36.8 C    Last Pain:  Vitals:   04/10/16 1043  TempSrc: Oral         Complications: No apparent anesthesia complications

## 2016-04-10 NOTE — Anesthesia Postprocedure Evaluation (Signed)
Anesthesia Post Note  Patient: Tammy Gray  Procedure(s) Performed: Procedure(s) (LRB): REMOVAL OF TISSUE EXPANDER AND PLACEMENT OF IMPLANT (Left) MASTOPEXY (Right) LIPOFILLING from abdomen to left chest (Left)  Patient location during evaluation: PACU Anesthesia Type: General Level of consciousness: awake and alert Pain management: pain level controlled Vital Signs Assessment: post-procedure vital signs reviewed and stable Respiratory status: spontaneous breathing, nonlabored ventilation, respiratory function stable and patient connected to nasal cannula oxygen Cardiovascular status: blood pressure returned to baseline and stable Postop Assessment: no signs of nausea or vomiting Anesthetic complications: no       Last Vitals:  Vitals:   04/10/16 1615 04/10/16 1630  BP: (!) 111/54 122/64  Pulse: 71 79  Resp: (!) 22 17  Temp:      Last Pain:  Vitals:   04/10/16 1626  TempSrc:   PainSc: 7                  Itzabella Sorrels S

## 2016-04-10 NOTE — Op Note (Signed)
Operative Note   DATE OF OPERATION: 12.22.17  LOCATION: [facility]-outpatient  SURGICAL DIVISION: Plastic Surgery  PREOPERATIVE DIAGNOSES:  1. History left breast cancer 2. Acquired absence breast 3. Asymmetry native and reconstructed breast  POSTOPERATIVE DIAGNOSES:  same  PROCEDURE:  1. Removal left tissue expander and placement saline implant, lateral capsulorraphy 2. Right mastopexy 3. Lipofilling to left chest  SURGEON: Irene Limbo MD MBA  ASSISTANT: none  ANESTHESIA:  General.   EBL: 123XX123 ml  COMPLICATIONS: None immediate.   INDICATIONS FOR PROCEDURE:  The patient, Tammy Gray, is a 50 y.o. female born on 12-04-1965, is here for second stage reconstruction following skin reduction pattern mastectomy on left with immediate expander, acellular dermis reconstruction.   FINDINGS:  Approximately 50% of acellular dermis not adherent and removed. 95 ml fat infiltrated over left chest.  56 g right mastopexy.   Natrelle Smooth Round High Profile Saline implant 650 ml ,filled to 650 ml placed in left chest. REF [id number] SN [id number].   DESCRIPTION OF PROCEDURE:  The patient's operative site was marked with the patient in the preoperative area to mark chest midline, sternal notch, desired anterior axillary line, as well as supra and infraumbilical abdomen donor site. Over right breast, location of inframammary fold transferred on anterior surface of breast. With aide of Wise pattern marker, location of new nipple areolar complex and 6 cm vertical limbs marked. The patientwas taken to the operating room. SCDs were placed and IV antibiotics were given. The patient's operative site was prepped and draped in a sterile fashion. A time out was performed and all information was confirmed to be correct.Incision made in left inframammary fold mastectomy scar and carried to capsule.Expander removed. Superior capsulotomies performed. As noted above, partial take of acellular dermis  noted and non adherent ADM excised. The pocket extended onto lateral chest wall and suture capsulorraphy completed with 2-0 PDS in figure of eight fashion at anterior axillary line to chest wall.  Sizer placed.  In supine position the inframammary fold was marked over right breast. The transverse limbs then marked. Superior pedicle designed. The NAC was marked with 42 mm cookie cutter. The remainder of pedicle deepithelialized and superiorly based flap developed. Over lower pole, skin and small amount subcutaneous tissue excised. The majority of tissue was preserved and used for autoaugmentation. The inferiorly based tissue was advanced superiorly deep to superior pedicle and secured to chest wall with interrupted 2-0 PDS. The patient was tailor tacked and brought to upright sitting position and assessed for symmetry. A Natrelle High Profile 650 ml implant selected for left chest. Patient returned to supine position. Right breast cavity irrigated and hemostasis obtained. Closure completed with interrupted 3-0 vicryl in dermis for closure of vertical and transverse limbs. The NAC was inset with 4-0 vicryl in dermis. Skin closure completed with 4-0 moncryl.  Stab incision made over bilateral lateral abdomen and tumescent fluid infiltrated over supra and infraumbilical abdomen, total 350 ml tumescent infiltrated. Power assisted liposuction performed to endpoint symmetric contour and soft tissue thickness. The fat was then washed and prepared by gravity for infiltration. Harvested fat was then infiltrated in subcutaneous and intramuscular planes throughout left mastectomy flap.   At this time left breast cavity irrigated with solution containing Ancef, gentamicin, and bacitracin, followed by Betadine. Implant placed in left breast cavity and filled to 650 ml. Fill tubing removed and care taken to ensure proper orientation implant, closure fill tab. Closure was completed with running 3-0 vicryl for approximation  of  capsule and superficial fascia. 4-0 vicryl was placed in dermis and running 4-0 monocryl was used to close skin.   Tissue adhesive applied to breast incisions.The abdominal incisions were approximated with 4-0 monocryl. Dry dressing and abdominal and breast binders applied.   The patient was allowed to wake from anesthesia, extubated and taken to the recovery room in satisfactory condition.   SPECIMENS: right mastopexy  DRAINS: 15 Fr JP in right breast  Irene Limbo, MD [facility] Plastic & Reconstructive Surgery [phone], pin [phone]

## 2016-04-10 NOTE — Anesthesia Preprocedure Evaluation (Signed)
Anesthesia Evaluation  Patient identified by MRN, date of birth, ID band Patient awake    Reviewed: Allergy & Precautions, NPO status , Patient's Chart, lab work & pertinent test results  Airway Mallampati: II       Dental no notable dental hx. (+) Teeth Intact   Pulmonary neg pulmonary ROS,    Pulmonary exam normal breath sounds clear to auscultation       Cardiovascular hypertension, Pt. on medications Normal cardiovascular exam Rhythm:Regular Rate:Normal     Neuro/Psych negative neurological ROS  negative psych ROS   GI/Hepatic Neg liver ROS, IBS   Endo/Other  Hx/o Left breast Ca S/P Left mastectomy  Renal/GU negative Renal ROS  negative genitourinary   Musculoskeletal negative musculoskeletal ROS (+)   Abdominal   Peds  Hematology negative hematology ROS (+)   Anesthesia Other Findings   Reproductive/Obstetrics HSV                             Lab Results  Component Value Date   WBC 5.9 01/01/2016   HGB 14.8 01/01/2016   HCT 44.6 01/01/2016   MCV 85.6 01/01/2016   PLT 258 01/01/2016     Chemistry      Component Value Date/Time   NA 138 04/07/2016 1245   NA 139 01/01/2016 1213   K 3.8 04/07/2016 1245   K 3.6 01/01/2016 1213   CL 104 04/07/2016 1245   CO2 26 04/07/2016 1245   CO2 25 01/01/2016 1213   BUN 10 04/07/2016 1245   BUN 18.1 01/01/2016 1213   CREATININE 0.70 04/07/2016 1245   CREATININE 0.8 01/01/2016 1213      Component Value Date/Time   CALCIUM 9.4 04/07/2016 1245   CALCIUM 9.5 01/01/2016 1213   ALKPHOS 22 (L) 01/01/2016 1213   AST 19 01/01/2016 1213   ALT 32 01/01/2016 1213   BILITOT 0.39 01/01/2016 1213      Anesthesia Physical Anesthesia Plan  ASA: II  Anesthesia Plan: General   Post-op Pain Management:    Induction: Intravenous  Airway Management Planned: LMA and Oral ETT  Additional Equipment:   Intra-op Plan:   Post-operative  Plan: Extubation in OR  Informed Consent: I have reviewed the patients History and Physical, chart, labs and discussed the procedure including the risks, benefits and alternatives for the proposed anesthesia with the patient or authorized representative who has indicated his/her understanding and acceptance.   Dental advisory given  Plan Discussed with: Anesthesiologist, CRNA and Surgeon  Anesthesia Plan Comments:         Anesthesia Quick Evaluation

## 2016-04-10 NOTE — Discharge Instructions (Signed)
About my Jackson-Pratt Bulb Drain ° °What is a Jackson-Pratt bulb? °A Jackson-Pratt is a soft, round device used to collect drainage. It is connected to a long, thin drainage catheter, which is held in place by one or two small stiches near your surgical incision site. When the bulb is squeezed, it forms a vacuum, forcing the drainage to empty into the bulb. ° °Emptying the Jackson-Pratt bulb- °To empty the bulb: °1. Release the plug on the top of the bulb. °2. Pour the bulb's contents into a measuring container which your nurse will provide. °3. Record the time emptied and amount of drainage. Empty the drain(s) as often as your     doctor or nurse recommends. ° °Date                  Time                    Amount (Drain 1)                 Amount (Drain 2) ° °_____________________________________________________________________ ° °_____________________________________________________________________ ° °_____________________________________________________________________ ° °_____________________________________________________________________ ° °_____________________________________________________________________ ° °_____________________________________________________________________ ° °_____________________________________________________________________ ° °_____________________________________________________________________ ° °Squeezing the Jackson-Pratt Bulb- °To squeeze the bulb: °1. Make sure the plug at the top of the bulb is open. °2. Squeeze the bulb tightly in your fist. You will hear air squeezing from the bulb. °3. Replace the plug while the bulb is squeezed. °4. Use a safety pin to attach the bulb to your clothing. This will keep the catheter from     pulling at the bulb insertion site. ° °When to call your doctor- °Call your doctor if: °· Drain site becomes red, swollen or hot. °· You have a fever greater than 101 degrees F. °· There is oozing at the drain site. °· Drain falls out (apply a guaze  bandage over the drain hole and secure it with tape). °· Drainage increases daily not related to activity patterns. (You will usually have more drainage when you are active than when you are resting.) °· Drainage has a bad odor. ° ° °Post Anesthesia Home Care Instructions ° °Activity: °Get plenty of rest for the remainder of the day. A responsible adult should stay with you for 24 hours following the procedure.  °For the next 24 hours, DO NOT: °-Drive a car °-Operate machinery °-Drink alcoholic beverages °-Take any medication unless instructed by your physician °-Make any legal decisions or sign important papers. ° °Meals: °Start with liquid foods such as gelatin or soup. Progress to regular foods as tolerated. Avoid greasy, spicy, heavy foods. If nausea and/or vomiting occur, drink only clear liquids until the nausea and/or vomiting subsides. Call your physician if vomiting continues. ° °Special Instructions/Symptoms: °Your throat may feel dry or sore from the anesthesia or the breathing tube placed in your throat during surgery. If this causes discomfort, gargle with warm salt water. The discomfort should disappear within 24 hours. ° °If you had a scopolamine patch placed behind your ear for the management of post- operative nausea and/or vomiting: ° °1. The medication in the patch is effective for 72 hours, after which it should be removed.  Wrap patch in a tissue and discard in the trash. Wash hands thoroughly with soap and water. °2. You may remove the patch earlier than 72 hours if you experience unpleasant side effects which may include dry mouth, dizziness or visual disturbances. °3. Avoid touching the patch. Wash your hands with soap and water after contact with the   patch. °  ° ° °

## 2016-04-10 NOTE — Anesthesia Procedure Notes (Signed)
Procedure Name: Intubation Performed by: Terrance Mass Pre-anesthesia Checklist: Patient identified, Emergency Drugs available, Suction available and Patient being monitored Patient Re-evaluated:Patient Re-evaluated prior to inductionOxygen Delivery Method: Circle system utilized Preoxygenation: Pre-oxygenation with 100% oxygen Intubation Type: IV induction Ventilation: Mask ventilation without difficulty Laryngoscope Size: Miller and 2 Grade View: Grade I Tube type: Oral Tube size: 7.0 mm Number of attempts: 1 Airway Equipment and Method: Stylet Placement Confirmation: ETT inserted through vocal cords under direct vision,  positive ETCO2 and breath sounds checked- equal and bilateral Secured at: 21 cm Tube secured with: Tape Dental Injury: Teeth and Oropharynx as per pre-operative assessment

## 2016-04-14 ENCOUNTER — Encounter (HOSPITAL_BASED_OUTPATIENT_CLINIC_OR_DEPARTMENT_OTHER): Payer: Self-pay | Admitting: Plastic Surgery

## 2016-07-02 NOTE — H&P (Signed)
  Subjective:     Patient ID: Tammy Gray is a 51 y.o. female.  HPI  Nearly 3 months post op left implant exchange and right mastopexy, lipofilling. Notes the left chest depression has worsened since last visit and can feel ribs.  Presented following screening MMG with calcification left breast.  In the lateral aspect of the left breast there is an area of 3 mm of course calcifications and 4 cm lateral to this there is an area of developing punctate calcifications measuring 4.7 cm. Biopsy of both areas DCIS, ER/PR +. Mastectomy completed given span of disease. Final pathology high grade DCIS, 0/4 SLN, margins close but negative. No need for adjuvant radiation per discussion at multidisciplinary conference.  Genetics-patient declined testing . On Tamoxifen.  Prior 36/38 D. Wearing 36 C now. Left mastectomy 687 g Right mastopexy 56 g  Review of Systems    Objective:   Physical Exam  Cardiovascular: Normal rate, regular rhythm and normal heart sounds.   Pulmonary/Chest: Effort normal and breath sounds normal.  Abdominal: Soft.   Chest: scars maturing intact, left implant has descended further on chest some with severe upper pole depression Abd: soft no contour depressions  Assessment:     Left breast DCIS s/p SSM, SLN, TE/ADM (Cortiva) reconstruction S/p left saline implant exchange, right mastopexy, lipofilling left chest, removal non adherent ADM    Plan:      Depression contour left chest and inferior displacement left breast implant. This has progressed since last visit and plan revision left breast with acellular dermis for reinforcement lower pole and lateral as needed. Will exchange implant, but keep same size and fill volume. Reviewed will use drain in this setting.   Plan repeat lipofilling left chest, plan abdominal and flank donor site, OP surgery. Reviewed variable take graft, fat necrosis that may present as nodules, donor site abnormalities, risk seroma,  infection, DVT/PE.   Rx for Percocet and Bactrim given today.  95 ml fat infiltrated over left chest.  Natrelle Smooth Round High Profile Saline implant 650 ml ,filled to 650 ml placed in left chest. REF [place], MD [facility] [phone], pin [phone]

## 2016-07-03 ENCOUNTER — Encounter: Payer: Self-pay | Admitting: *Deleted

## 2016-07-06 ENCOUNTER — Telehealth: Payer: Self-pay | Admitting: Adult Health

## 2016-07-06 NOTE — Telephone Encounter (Signed)
Called to inform patient of added Survivorship appt added. Left message. Patient is coming in tomorrow - I put a note in the appt for patient to receive a new schedule print out.

## 2016-07-07 ENCOUNTER — Ambulatory Visit (HOSPITAL_BASED_OUTPATIENT_CLINIC_OR_DEPARTMENT_OTHER): Payer: 59 | Admitting: Oncology

## 2016-07-07 VITALS — BP 130/52 | HR 84 | Temp 98.2°F | Resp 18 | Ht 63.0 in | Wt 167.8 lb

## 2016-07-07 DIAGNOSIS — Z170 Estrogen receptor positive status [ER+]: Secondary | ICD-10-CM

## 2016-07-07 DIAGNOSIS — Z79810 Long term (current) use of selective estrogen receptor modulators (SERMs): Secondary | ICD-10-CM

## 2016-07-07 DIAGNOSIS — D0512 Intraductal carcinoma in situ of left breast: Secondary | ICD-10-CM

## 2016-07-07 DIAGNOSIS — C50412 Malignant neoplasm of upper-outer quadrant of left female breast: Secondary | ICD-10-CM

## 2016-07-07 NOTE — Progress Notes (Addendum)
[facility]  [phone] [phone]     ID: Tammy Gray DOB: 04/02/1966  MR#: [id number]  CSN#:[id number]  Patient Care Team: Robert Wein, MD as PCP - General (Obstetrics and Gynecology) Faera Byerly, MD as Consulting Physician (General Surgery) Gustav C Magrinat, MD as Consulting Physician (Oncology) John Moody, MD as Consulting Physician (Radiation Oncology) Daniel Jones, MD as Consulting Physician (Dermatology) Brinda Thimmappa, MD as Consulting Physician (Plastic Surgery) OTHER MD:  CHIEF COMPLAINT: Ductal carcinoma in situ  CURRENT TREATMENT: Tamoxifen   BREAST CANCER HISTORY: From the original intake note:  Tammy Gray had routine screening mammography at [facility] 12/05/2015. There were calcifications in the left breast which were felt to warrant further evaluation. Accordingly on 12/16/2015 she underwent [facility]. This showed the breast density to be category C. In the lateral aspect of the left breast there was an area of 3 mm of course calcifications and 4 cm lateral to this there is an area of developing punctate calcifications measuring 4.7 cm.  Biopsy of these 2 areas was performed 12/20/2015. Both showed high-grade ductal carcinoma in situ, both were estrogen receptor positive (80-90%) and progesterone receptor positive (5-90%), all these being with moderate staining intensity.  Her subsequent history is as detailed below  INTERVAL HISTORY: Tammy Gray returns today for follow-up of her ductal carcinoma in situ. She continues on tamoxifen, with good tolerance. Hot flashes and vaginal wetness are not major issues.  She is having somewhat irregular periods, which come about every 2 months now. Sometimes they're [place] and sometimes heavy period and they are [place] she tends to feel more bloated. She has no other side effects that she is aware of and she is obtaining the drug at a very good  price  Quite aside from this she continues to have reconstruction going on and is planning to have repositioning of the breast 07/24/2016 plus or minus some fat grafting   REVIEW OF SYSTEMS: A detailed review of systems today was otherwise noncontributory  PAST MEDICAL HISTORY: Past Medical History:  Diagnosis Date  . Breast cancer of upper-outer quadrant of left female breast (HCC) 12/25/2015  . Herpes 1987  . Hypertension   . IBS (irritable bowel syndrome)     PAST SURGICAL HISTORY: Past Surgical History:  Procedure Laterality Date  . BREAST RECONSTRUCTION WITH PLACEMENT OF TISSUE EXPANDER AND FLEX HD (ACELLULAR HYDRATED DERMIS) Left 01/28/2016   Procedure: LEFT BREAST RECONSTRUCTION WITH PLACEMENT OF TISSUE EXPANDER AND CORTIVA;  Surgeon: Brinda Thimmappa, MD;  Location: [facility];  Service: Plastics;  Laterality: Left;  . CESAREAN SECTION    . LIPOSUCTION WITH LIPOFILLING Left 04/10/2016   Procedure: LIPOFILLING from abdomen to left chest;  Surgeon: Brinda Thimmappa, MD;  Location: [facility];  Service: Plastics;  Laterality: Left;  . MASTECTOMY W/ SENTINEL NODE BIOPSY Left 01/28/2016   Procedure: LEFT MASTECTOMY WITH SENTINEL LYMPH NODE BIOPSY;  Surgeon: Faera Byerly, MD;  Location: [facility];  Service: General;  Laterality: Left;  . MASTOPEXY Right 04/10/2016   Procedure: MASTOPEXY;  Surgeon: Brinda Thimmappa, MD;  Location: [facility];  Service: Plastics;  Laterality: Right;  . REMOVAL OF TISSUE EXPANDER AND PLACEMENT OF IMPLANT Left 04/10/2016   Procedure: REMOVAL OF TISSUE EXPANDER AND PLACEMENT OF IMPLANT;  Surgeon: Brinda Thimmappa, MD;  Location: [facility];  Service: Plastics;  Laterality: Left;    FAMILY HISTORY Family History  Problem Relation Age of   Onset  . Other Mother 25    hx of hysterectomy to treat endometriosis and heavy bleeding  . Prostate cancer Father 62    unspecified Gleason  score; +prostatectomy  . Colon cancer Paternal Grandmother     dx late 70s; w/ mets, d. 84  . Colon cancer Paternal Grandfather     dx. late 70s  . Stroke Maternal Grandmother   . Diverticulitis Maternal Grandmother   . Hodgkin's lymphoma Cousin     maternal 1st cousin dx. in his 20s-30s  . Breast cancer Cousin     paternal 1st cousin, once-removed (on PGF's side of family) dx late 60s  The patient's father is 74 as of September 2017. He has a history of prostate cancer diagnosed age 62. The patient's mother is also alive, also 74 as of September 2017. On the paternal side a grandfather had colon cancer and a grandmother also colon cancer. There is no history of breast or ovarian cancer in the family to the patient's knowledge.  GYNECOLOGIC HISTORY:  No LMP recorded. She still having regular periods, and was on oral contraceptives until the time of this diagnosis, when this was stopped.  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 She is divorced. At home are her 2 sons Zachary, age 20, studying business at [employer], and [name], 17, currently in high school    ADVANCED DIRECTIVES: Not in place. At the 01/01/2016 visit the patient was given the appropriate documents to complete and notarize at her discretion    HEALTH MAINTENANCE: Social History  Substance Use Topics  . Smoking status: Never Smoker  . Smokeless tobacco: Never Used  . Alcohol use No     Colonoscopy:Never  PAP: Up-to-date  Bone density: Never   Not on File  Current Outpatient Prescriptions  Medication Sig Dispense Refill  . dicyclomine (BENTYL) 10 MG capsule Take 10 mg by mouth as needed for spasms.    . hydrochlorothiazide (MICROZIDE) 12.5 MG capsule Take 12.5 mg by mouth daily.    . lisinopril (PRINIVIL,ZESTRIL) 5 MG tablet Take 5 mg by mouth daily.    . loratadine (CLARITIN) 10 MG tablet Take 10 mg by mouth daily.    . Multiple Vitamins-Minerals (WOMENS MULTIVITAMIN PO) Take 1  tablet by mouth daily.    . tamoxifen (NOLVADEX) 20 MG tablet Take 1 tablet (20 mg total) by mouth daily. 90 tablet 3  . valACYclovir (VALTREX) 500 MG tablet Take 500 mg by mouth daily.     No current facility-administered medications for this visit.     OBJECTIVE: Middle-aged white Gray Who appears stated age  Vitals:   07/07/16 1524  BP: (!) 130/52  Pulse: 84  Resp: 18  Temp: 98.2 F (36.8 C)     Body mass index is 29.72 kg/m.    ECOG FS:1 - Symptomatic but completely ambulatory  Sclerae unicteric, EOMs intact Oropharynx clear and moist No cervical or supraclavicular adenopathy Lungs no rales or rhonchi Heart regular rate and rhythm Abd soft, nontender, positive bowel sounds MSK no focal spinal tenderness, no upper extremity lymphedema Neuro: nonfocal, well oriented, appropriate affect Breasts: The right breast is unremarkable. Left breast is undergone mastectomy. There is some asymmetry. There is no evidence of local recurrence of disease in both axillae are benign.   RESULTS:  CMP     Component Value Date/Time   NA 138 04/07/2016 1245   NA 139 01/01/2016 1213   K 3.8 04/07/2016 1245   K 3.6   01/01/2016 1213   CL 104 04/07/2016 1245   CO2 26 04/07/2016 1245   CO2 25 01/01/2016 1213   GLUCOSE 81 04/07/2016 1245   GLUCOSE 120 01/01/2016 1213   BUN 10 04/07/2016 1245   BUN 18.1 01/01/2016 1213   CREATININE 0.70 04/07/2016 1245   CREATININE 0.8 01/01/2016 1213   CALCIUM 9.4 04/07/2016 1245   CALCIUM 9.5 01/01/2016 1213   PROT 7.6 01/01/2016 1213   ALBUMIN 4.0 01/01/2016 1213   AST 19 01/01/2016 1213   ALT 32 01/01/2016 1213   ALKPHOS 22 (L) 01/01/2016 1213   BILITOT 0.39 01/01/2016 1213   GFRNONAA >60 04/07/2016 1245   GFRAA >60 04/07/2016 1245    INo results found for: SPEP, UPEP  Lab Results  Component Value Date   WBC 5.9 01/01/2016   NEUTROABS 3.3 01/01/2016   HGB 14.8 01/01/2016   HCT 44.6 01/01/2016   MCV 85.6 01/01/2016   PLT 258 01/01/2016       Chemistry      Component Value Date/Time   NA 138 04/07/2016 1245   NA 139 01/01/2016 1213   K 3.8 04/07/2016 1245   K 3.6 01/01/2016 1213   CL 104 04/07/2016 1245   CO2 26 04/07/2016 1245   CO2 25 01/01/2016 1213   BUN 10 04/07/2016 1245   BUN 18.1 01/01/2016 1213   CREATININE 0.70 04/07/2016 1245   CREATININE 0.8 01/01/2016 1213      Component Value Date/Time   CALCIUM 9.4 04/07/2016 1245   CALCIUM 9.5 01/01/2016 1213   ALKPHOS 22 (L) 01/01/2016 1213   AST 19 01/01/2016 1213   ALT 32 01/01/2016 1213   BILITOT 0.39 01/01/2016 1213       No results found for: LABCA2  No components found for: LABCA125  No results for input(s): INR in the last 168 hours.  Urinalysis No results found for: COLORURINE, APPEARANCEUR, LABSPEC, PHURINE, GLUCOSEU, HGBUR, BILIRUBINUR, KETONESUR, PROTEINUR, UROBILINOGEN, NITRITE, LEUKOCYTESUR   STUDIES: No results found.  ELIGIBLE FOR AVAILABLE RESEARCH PROTOCOL: no  ASSESSMENT: 50 y.o. Tammy Gray, status post left breast upper outer quadrant biopsy 12/20/2015 for ductal carcinoma in situ, grade 3, estrogen and progesterone receptor positive.  (1) genetics testing 01/07/2016 through the Breast/Ovarian Cancer Panel through [facility] including testing for ATM, BARD1, BRCA1, BRCA2, BRIP1, CDH1, CHEK2, FANCC, MLH1, MSH2, MSH6, NBN, PALB2, PMS2, PTEN, RAD51C, RAD51D, TP53, and XRCC2 was canceled by the patient  (2) status post left mastectomy and sentinel lymph node sampling 01/28/2016 for a pTis pN0, stage 0 ductal carcinoma in situ, grade 3, with close but negative margins.   (3) no need for adjuvant radiation per discussion at multidisciplinary conference 02/12/2016  4) tamoxifen started 01/01/2016   PLAN: Tammy Gray is now approximately 6 months out from her mastectomy for noninvasive breast cancer. She knows that she is almost certainly cured from that malignancy.  She is taking tamoxifen for  prophylaxis. Accordingly we have a low threshold to stop this medication.  However if she does stop the tamoxifen she may still have irregular periods since she is going through that "change of life" portion at this time. We discussed other options including adding a Mirena IUD, which might possibly be helpful, or stopping tamoxifen now but resuming it or an aromatase inhibitor once she is postmenopausal.  After all this discussion when she really wants to do is simply continue tamoxifen. She doesn't feel the menstrual bloating is that bad.  Since she will be seeing Dr. Byerly   in July, she will see me again January of next year. She knows to call for any problems that may develop before then.  Chauncey Cruel, MD   07/07/2016 4:47 PM Medical Oncology and Hematology [facility] [address] [place], [place] [address] Tel. [phone]    Fax. [phone]

## 2016-07-16 ENCOUNTER — Encounter (HOSPITAL_BASED_OUTPATIENT_CLINIC_OR_DEPARTMENT_OTHER): Payer: Self-pay | Admitting: *Deleted

## 2016-07-21 ENCOUNTER — Encounter (HOSPITAL_BASED_OUTPATIENT_CLINIC_OR_DEPARTMENT_OTHER)
Admission: RE | Admit: 2016-07-21 | Discharge: 2016-07-21 | Disposition: A | Discharge status: Home / Self Care | Payer: 59 | Source: Ambulatory Visit | Attending: Plastic Surgery | Admitting: Plastic Surgery

## 2016-07-21 ENCOUNTER — Encounter (HOSPITAL_COMMUNITY): Payer: Self-pay

## 2016-07-21 DIAGNOSIS — Y832 Surgical operation with anastomosis, bypass or graft as the cause of abnormal reaction of the patient, or of later complication, without mention of misadventure at the time of the procedure: Secondary | ICD-10-CM | POA: Diagnosis not present

## 2016-07-21 DIAGNOSIS — I10 Essential (primary) hypertension: Secondary | ICD-10-CM | POA: Diagnosis not present

## 2016-07-21 DIAGNOSIS — Z79899 Other long term (current) drug therapy: Secondary | ICD-10-CM | POA: Diagnosis not present

## 2016-07-21 DIAGNOSIS — Z45812 Encounter for adjustment or removal of left breast implant: Secondary | ICD-10-CM | POA: Diagnosis present

## 2016-07-21 DIAGNOSIS — T8542XA Displacement of breast prosthesis and implant, initial encounter: Secondary | ICD-10-CM | POA: Diagnosis not present

## 2016-07-21 DIAGNOSIS — Z86000 Personal history of in-situ neoplasm of breast: Secondary | ICD-10-CM | POA: Diagnosis not present

## 2016-07-21 DIAGNOSIS — Z79810 Long term (current) use of selective estrogen receptor modulators (SERMs): Secondary | ICD-10-CM | POA: Diagnosis not present

## 2016-07-21 DIAGNOSIS — Z9012 Acquired absence of left breast and nipple: Secondary | ICD-10-CM | POA: Diagnosis not present

## 2016-07-21 LAB — BASIC METABOLIC PANEL
Anion gap: 8 (ref 5–15)
BUN: 14 mg/dL (ref 6–20)
CHLORIDE: 103 mmol/L (ref 101–111)
CO2: 27 mmol/L (ref 22–32)
CREATININE: 0.65 mg/dL (ref 0.44–1.00)
Calcium: 9.1 mg/dL (ref 8.9–10.3)
GFR calc non Af Amer: >60 mL/min (ref >60)
GLUCOSE: 127 mg/dL — AB (ref 65–99)
Potassium: 4.1 mmol/L (ref 3.5–5.1)
Sodium: 138 mmol/L (ref 135–145)

## 2016-07-21 NOTE — Progress Notes (Signed)
Pt given Boost drink and instructed to drink by 0400 day of surgery with teach back.

## 2016-07-23 NOTE — Anesthesia Preprocedure Evaluation (Addendum)
Anesthesia Evaluation  Patient identified by MRN, date of birth, ID band Patient awake    Reviewed: Allergy & Precautions, NPO status , Patient's Chart, lab work & pertinent test results  Airway Mallampati: II  TM Distance: >3 FB Neck ROM: Full    Dental  (+) Teeth Intact, Dental Advisory Given   Pulmonary neg pulmonary ROS,    Pulmonary exam normal breath sounds clear to auscultation       Cardiovascular hypertension, Pt. on medications Normal cardiovascular exam Rhythm:Regular Rate:Normal     Neuro/Psych negative neurological ROS     GI/Hepatic negative GI ROS, Neg liver ROS,   Endo/Other  negative endocrine ROS  Renal/GU negative Renal ROS     Musculoskeletal negative musculoskeletal ROS (+)   Abdominal   Peds  Hematology negative hematology ROS (+)   Anesthesia Other Findings Day of surgery medications reviewed with the patient.  Left breast cancer  Reproductive/Obstetrics                            Anesthesia Physical Anesthesia Plan  ASA: II  Anesthesia Plan: General   Post-op Pain Management:    Induction: Intravenous  Airway Management Planned: Oral ETT  Additional Equipment:   Intra-op Plan:   Post-operative Plan: Extubation in OR  Informed Consent: I have reviewed the patients History and Physical, chart, labs and discussed the procedure including the risks, benefits and alternatives for the proposed anesthesia with the patient or authorized representative who has indicated his/her understanding and acceptance.   Dental advisory given  Plan Discussed with: CRNA  Anesthesia Plan Comments: (Risks/benefits of general anesthesia discussed with patient including risk of damage to teeth, lips, gum, and tongue, nausea/vomiting, allergic reactions to medications, and the possibility of heart attack, stroke and death.  All patient questions answered.  Patient wishes to  proceed.)       Anesthesia Quick Evaluation

## 2016-07-24 ENCOUNTER — Ambulatory Visit (HOSPITAL_BASED_OUTPATIENT_CLINIC_OR_DEPARTMENT_OTHER)
Admission: RE | Admit: 2016-07-24 | Discharge: 2016-07-24 | Disposition: A | Discharge status: Home / Self Care | Payer: 59 | Source: Ambulatory Visit | Attending: Plastic Surgery | Admitting: Plastic Surgery

## 2016-07-24 ENCOUNTER — Encounter (HOSPITAL_BASED_OUTPATIENT_CLINIC_OR_DEPARTMENT_OTHER)
Admission: RE | Disposition: A | Discharge status: Home / Self Care | Payer: Self-pay | Source: Ambulatory Visit | Attending: Plastic Surgery

## 2016-07-24 ENCOUNTER — Ambulatory Visit (HOSPITAL_BASED_OUTPATIENT_CLINIC_OR_DEPARTMENT_OTHER): Payer: 59 | Admitting: Anesthesiology

## 2016-07-24 ENCOUNTER — Encounter (HOSPITAL_BASED_OUTPATIENT_CLINIC_OR_DEPARTMENT_OTHER): Payer: Self-pay | Admitting: *Deleted

## 2016-07-24 DIAGNOSIS — Y832 Surgical operation with anastomosis, bypass or graft as the cause of abnormal reaction of the patient, or of later complication, without mention of misadventure at the time of the procedure: Secondary | ICD-10-CM | POA: Insufficient documentation

## 2016-07-24 DIAGNOSIS — Z79810 Long term (current) use of selective estrogen receptor modulators (SERMs): Secondary | ICD-10-CM | POA: Insufficient documentation

## 2016-07-24 DIAGNOSIS — T8542XA Displacement of breast prosthesis and implant, initial encounter: Secondary | ICD-10-CM | POA: Insufficient documentation

## 2016-07-24 DIAGNOSIS — Z79899 Other long term (current) drug therapy: Secondary | ICD-10-CM | POA: Insufficient documentation

## 2016-07-24 DIAGNOSIS — I10 Essential (primary) hypertension: Secondary | ICD-10-CM | POA: Insufficient documentation

## 2016-07-24 DIAGNOSIS — Z9012 Acquired absence of left breast and nipple: Secondary | ICD-10-CM | POA: Insufficient documentation

## 2016-07-24 DIAGNOSIS — Z86000 Personal history of in-situ neoplasm of breast: Secondary | ICD-10-CM | POA: Insufficient documentation

## 2016-07-24 HISTORY — PX: BREAST IMPLANT EXCHANGE: SHX6296

## 2016-07-24 HISTORY — PX: LIPOSUCTION WITH LIPOFILLING: SHX6436

## 2016-07-24 SURGERY — LIPOSUCTION, WITH FAT TRANSFER
Anesthesia: General | Site: Chest | Laterality: Left

## 2016-07-24 MED ORDER — MIDAZOLAM HCL 2 MG/2ML IJ SOLN
INTRAMUSCULAR | Status: AC
  Filled 2016-07-24: qty 2

## 2016-07-24 MED ORDER — DEXAMETHASONE SODIUM PHOSPHATE 4 MG/ML IJ SOLN
INTRAMUSCULAR | Status: DC | PRN
  Administered 2016-07-24: 10 mg via INTRAVENOUS

## 2016-07-24 MED ORDER — LIDOCAINE HCL (CARDIAC) 20 MG/ML IV SOLN
INTRAVENOUS | Status: DC | PRN
  Administered 2016-07-24: 50 mg via INTRAVENOUS

## 2016-07-24 MED ORDER — CHLORHEXIDINE GLUCONATE CLOTH 2 % EX PADS: 6.0000 | MEDICATED_PAD | Freq: Once | CUTANEOUS | Status: DC

## 2016-07-24 MED ORDER — FENTANYL CITRATE (PF) 100 MCG/2ML IJ SOLN
INTRAMUSCULAR | Status: AC
  Filled 2016-07-24: qty 2

## 2016-07-24 MED ORDER — ACETAMINOPHEN 500 MG PO TABS
1000.0000 mg | ORAL_TABLET | ORAL | Status: AC
  Administered 2016-07-24: 1000 mg via ORAL

## 2016-07-24 MED ORDER — CEFAZOLIN SODIUM-DEXTROSE 2-4 GM/100ML-% IV SOLN
2.0000 g | INTRAVENOUS | Status: AC
  Administered 2016-07-24: 2 g via INTRAVENOUS

## 2016-07-24 MED ORDER — SODIUM BICARBONATE 4 % IV SOLN
INTRAVENOUS | Status: AC
  Filled 2016-07-24: qty 20

## 2016-07-24 MED ORDER — 0.9 % SODIUM CHLORIDE (POUR BTL) OPTIME
TOPICAL | Status: DC | PRN
  Administered 2016-07-24: 1000 mL

## 2016-07-24 MED ORDER — GABAPENTIN 300 MG PO CAPS
300.0000 mg | ORAL_CAPSULE | ORAL | Status: AC
  Administered 2016-07-24: 300 mg via ORAL

## 2016-07-24 MED ORDER — PHENYLEPHRINE HCL 10 MG/ML IJ SOLN
INTRAVENOUS | Status: DC | PRN
  Administered 2016-07-24: 50 ug/min via INTRAVENOUS

## 2016-07-24 MED ORDER — CELECOXIB 400 MG PO CAPS
400.0000 mg | ORAL_CAPSULE | ORAL | Status: AC
  Administered 2016-07-24: 400 mg via ORAL

## 2016-07-24 MED ORDER — KETOROLAC TROMETHAMINE 30 MG/ML IJ SOLN
INTRAMUSCULAR | Status: DC | PRN
  Administered 2016-07-24: 30 mg via INTRAVENOUS

## 2016-07-24 MED ORDER — MIDAZOLAM HCL 2 MG/2ML IJ SOLN
1.0000 mg | INTRAMUSCULAR | Status: DC | PRN
  Administered 2016-07-24: 2 mg via INTRAVENOUS

## 2016-07-24 MED ORDER — ROCURONIUM BROMIDE 100 MG/10ML IV SOLN
INTRAVENOUS | Status: DC | PRN
  Administered 2016-07-24: 50 mg via INTRAVENOUS
  Administered 2016-07-24: 20 mg via INTRAVENOUS

## 2016-07-24 MED ORDER — KETOROLAC TROMETHAMINE 30 MG/ML IJ SOLN
INTRAMUSCULAR | Status: AC
  Filled 2016-07-24: qty 1

## 2016-07-24 MED ORDER — ONDANSETRON HCL 4 MG/2ML IJ SOLN
INTRAMUSCULAR | Status: DC | PRN
  Administered 2016-07-24: 4 mg via INTRAVENOUS

## 2016-07-24 MED ORDER — FENTANYL CITRATE (PF) 100 MCG/2ML IJ SOLN
25.0000 ug | INTRAMUSCULAR | Status: DC | PRN
  Administered 2016-07-24: 50 ug via INTRAVENOUS

## 2016-07-24 MED ORDER — DEXAMETHASONE SODIUM PHOSPHATE 10 MG/ML IJ SOLN
INTRAMUSCULAR | Status: AC
  Filled 2016-07-24: qty 1

## 2016-07-24 MED ORDER — ROCURONIUM BROMIDE 50 MG/5ML IV SOSY
PREFILLED_SYRINGE | INTRAVENOUS | Status: AC
  Filled 2016-07-24: qty 5

## 2016-07-24 MED ORDER — PROMETHAZINE HCL 25 MG/ML IJ SOLN: 6.2500 mg | INTRAMUSCULAR | Status: DC | PRN

## 2016-07-24 MED ORDER — SCOPOLAMINE 1 MG/3DAYS TD PT72
MEDICATED_PATCH | TRANSDERMAL | Status: AC
  Filled 2016-07-24: qty 1

## 2016-07-24 MED ORDER — SODIUM CHLORIDE 0.9 % IV SOLN
INTRAVENOUS | Status: DC | PRN
  Administered 2016-07-24: 1000 mL

## 2016-07-24 MED ORDER — SCOPOLAMINE 1 MG/3DAYS TD PT72
1.0000 | MEDICATED_PATCH | Freq: Once | TRANSDERMAL | Status: DC | PRN
  Administered 2016-07-24: 1.5 mg via TRANSDERMAL

## 2016-07-24 MED ORDER — ONDANSETRON HCL 4 MG/2ML IJ SOLN
INTRAMUSCULAR | Status: AC
  Filled 2016-07-24: qty 2

## 2016-07-24 MED ORDER — EPINEPHRINE 30 MG/30ML IJ SOLN
INTRAMUSCULAR | Status: AC
  Filled 2016-07-24: qty 1

## 2016-07-24 MED ORDER — LACTATED RINGERS IV SOLN
INTRAVENOUS | Status: DC
  Administered 2016-07-24: 07:00:00 via INTRAVENOUS

## 2016-07-24 MED ORDER — CELECOXIB 200 MG PO CAPS
ORAL_CAPSULE | ORAL | Status: AC
  Filled 2016-07-24: qty 2

## 2016-07-24 MED ORDER — PHENYLEPHRINE HCL 10 MG/ML IJ SOLN
INTRAMUSCULAR | Status: AC
  Filled 2016-07-24: qty 1

## 2016-07-24 MED ORDER — LIDOCAINE HCL (PF) 1 % IJ SOLN
INTRAMUSCULAR | Status: AC
  Filled 2016-07-24: qty 120

## 2016-07-24 MED ORDER — SUGAMMADEX SODIUM 200 MG/2ML IV SOLN
INTRAVENOUS | Status: AC
  Filled 2016-07-24: qty 2

## 2016-07-24 MED ORDER — CEFAZOLIN SODIUM-DEXTROSE 2-4 GM/100ML-% IV SOLN
INTRAVENOUS | Status: AC
  Filled 2016-07-24: qty 100

## 2016-07-24 MED ORDER — SUGAMMADEX SODIUM 200 MG/2ML IV SOLN
INTRAVENOUS | Status: DC | PRN
  Administered 2016-07-24: 200 mg via INTRAVENOUS

## 2016-07-24 MED ORDER — GABAPENTIN 300 MG PO CAPS
ORAL_CAPSULE | ORAL | Status: AC
Start: 2016-07-24 — End: 2016-07-24
  Filled 2016-07-24: qty 1

## 2016-07-24 MED ORDER — ACETAMINOPHEN 500 MG PO TABS
ORAL_TABLET | ORAL | Status: AC
  Filled 2016-07-24: qty 2

## 2016-07-24 MED ORDER — FENTANYL CITRATE (PF) 100 MCG/2ML IJ SOLN
50.0000 ug | INTRAMUSCULAR | Status: DC | PRN
  Administered 2016-07-24 (×2): 100 ug via INTRAVENOUS

## 2016-07-24 MED ORDER — LIDOCAINE 2% (20 MG/ML) 5 ML SYRINGE
INTRAMUSCULAR | Status: AC
  Filled 2016-07-24: qty 5

## 2016-07-24 MED ORDER — SODIUM BICARBONATE 4 % IV SOLN
INTRAVENOUS | Status: DC | PRN
  Administered 2016-07-24: 400 mL via INTRAMUSCULAR

## 2016-07-24 MED ORDER — PROPOFOL 10 MG/ML IV BOLUS
INTRAVENOUS | Status: DC | PRN
  Administered 2016-07-24 (×2): 150 mg via INTRAVENOUS

## 2016-07-24 SURGICAL SUPPLY — 75 items
ALLODERM 8X16 READY TO USE (Tissue) ×2 IMPLANT
ALLODERM RTU 8X16 (Tissue) ×2 IMPLANT
BAG DECANTER FOR FLEXI CONT (MISCELLANEOUS) ×4 IMPLANT
BINDER ABDOMINAL 10 UNV 27-48 (MISCELLANEOUS) ×4 IMPLANT
BINDER ABDOMINAL 12 SM 30-45 (SOFTGOODS) IMPLANT
BINDER BREAST LRG (GAUZE/BANDAGES/DRESSINGS) IMPLANT
BINDER BREAST MEDIUM (GAUZE/BANDAGES/DRESSINGS) IMPLANT
BINDER BREAST XLRG (GAUZE/BANDAGES/DRESSINGS) ×4 IMPLANT
BINDER BREAST XXLRG (GAUZE/BANDAGES/DRESSINGS) IMPLANT
BLADE SURG 10 STRL SS (BLADE) ×4 IMPLANT
BLADE SURG 11 STRL SS (BLADE) ×4 IMPLANT
BNDG GAUZE ELAST 4 BULKY (GAUZE/BANDAGES/DRESSINGS) ×8 IMPLANT
CANISTER LIPO FAT HARVEST (MISCELLANEOUS) IMPLANT
CANISTER SUCT 1200ML W/VALVE (MISCELLANEOUS) ×4 IMPLANT
CHLORAPREP W/TINT 26ML (MISCELLANEOUS) ×8 IMPLANT
COVER BACK TABLE 60X90IN (DRAPES) ×4 IMPLANT
COVER MAYO STAND STRL (DRAPES) ×4 IMPLANT
DRAIN CHANNEL 15F RND FF W/TCR (WOUND CARE) ×4 IMPLANT
DRAPE TOP ARMCOVERS (MISCELLANEOUS) ×4 IMPLANT
DRAPE U-SHAPE 76X120 STRL (DRAPES) ×4 IMPLANT
DRSG PAD ABDOMINAL 8X10 ST (GAUZE/BANDAGES/DRESSINGS) IMPLANT
ELECT BLADE 4.0 EZ CLEAN MEGAD (MISCELLANEOUS) ×4
ELECT COATED BLADE 2.86 ST (ELECTRODE) ×4 IMPLANT
ELECT REM PT RETURN 9FT ADLT (ELECTROSURGICAL) ×4
ELECTRODE BLDE 4.0 EZ CLN MEGD (MISCELLANEOUS) ×2 IMPLANT
ELECTRODE REM PT RTRN 9FT ADLT (ELECTROSURGICAL) ×2 IMPLANT
EVACUATOR SILICONE 100CC (DRAIN) ×4 IMPLANT
EXTRACTOR CANIST REVOLVE STRL (CANNISTER) ×4 IMPLANT
FILTER LIPOSUCTION (MISCELLANEOUS) ×4 IMPLANT
GLOVE BIO SURGEON STRL SZ 6 (GLOVE) ×4 IMPLANT
GOWN STRL REUS W/ TWL LRG LVL3 (GOWN DISPOSABLE) ×4 IMPLANT
GOWN STRL REUS W/TWL LRG LVL3 (GOWN DISPOSABLE) ×4
IMPL BREAST SALINE 650-700C (Breast) ×2 IMPLANT
IMPLANT BREAST SALINE 650-700C (Breast) ×4 IMPLANT
IV LACTATED RINGERS 1000ML (IV SOLUTION) ×4 IMPLANT
IV NS 1000ML (IV SOLUTION) ×4
IV NS 1000ML BAXH (IV SOLUTION) ×4 IMPLANT
IV NS 500ML (IV SOLUTION)
IV NS 500ML BAXH (IV SOLUTION) IMPLANT
KIT FILL SYSTEM UNIVERSAL (SET/KITS/TRAYS/PACK) IMPLANT
LINER CANISTER 1000CC FLEX (MISCELLANEOUS) ×12 IMPLANT
LIQUID BAND (GAUZE/BANDAGES/DRESSINGS) ×8 IMPLANT
NDL SAFETY ECLIPSE 18X1.5 (NEEDLE) ×2 IMPLANT
NEEDLE HYPO 18GX1.5 SHARP (NEEDLE) ×2
NS IRRIG 1000ML POUR BTL (IV SOLUTION) ×4 IMPLANT
PACK BASIN DAY SURGERY FS (CUSTOM PROCEDURE TRAY) ×4 IMPLANT
PAD ALCOHOL SWAB (MISCELLANEOUS) ×4 IMPLANT
PENCIL BUTTON HOLSTER BLD 10FT (ELECTRODE) ×4 IMPLANT
PIN SAFETY STERILE (MISCELLANEOUS) ×4 IMPLANT
SHEET MEDIUM DRAPE 40X70 STRL (DRAPES) ×8 IMPLANT
SLEEVE SCD COMPRESS KNEE MED (MISCELLANEOUS) ×4 IMPLANT
SPONGE LAP 18X18 X RAY DECT (DISPOSABLE) ×12 IMPLANT
STAPLER VISISTAT 35W (STAPLE) ×4 IMPLANT
SUT ETHILON 2 0 FS 18 (SUTURE) ×4 IMPLANT
SUT MNCRL AB 4-0 PS2 18 (SUTURE) ×4 IMPLANT
SUT PDS 3-0 CT2 (SUTURE)
SUT PDS AB 2-0 CT2 27 (SUTURE) ×16 IMPLANT
SUT PDS II 3-0 CT2 27 ABS (SUTURE) IMPLANT
SUT VIC AB 3-0 PS1 18 (SUTURE)
SUT VIC AB 3-0 PS1 18XBRD (SUTURE) IMPLANT
SUT VIC AB 3-0 SH 27 (SUTURE) ×2
SUT VIC AB 3-0 SH 27X BRD (SUTURE) ×2 IMPLANT
SUT VICRYL 4-0 PS2 18IN ABS (SUTURE) ×4 IMPLANT
SYR 10ML LL (SYRINGE) ×12 IMPLANT
SYR 50ML LL SCALE MARK (SYRINGE) ×16 IMPLANT
SYR BULB IRRIGATION 50ML (SYRINGE) ×4 IMPLANT
SYR TB 1ML LL NO SAFETY (SYRINGE) ×4 IMPLANT
TISSUE ALLDRM RTU 8X16 (Tissue) ×2 IMPLANT
TOWEL OR 17X24 6PK STRL BLUE (TOWEL DISPOSABLE) ×8 IMPLANT
TUBE CONNECTING 20'X1/4 (TUBING) ×2
TUBE CONNECTING 20X1/4 (TUBING) ×6 IMPLANT
TUBING INFILTRATION IT-10001 (TUBING) ×4 IMPLANT
TUBING SET GRADUATE ASPIR 12FT (MISCELLANEOUS) ×4 IMPLANT
UNDERPAD 30X30 (UNDERPADS AND DIAPERS) ×8 IMPLANT
YANKAUER SUCT BULB TIP NO VENT (SUCTIONS) ×4 IMPLANT

## 2016-07-24 NOTE — Interval H&P Note (Signed)
History and Physical Interval Note:  07/24/2016 6:56 AM  Tammy Gray  has presented today for surgery, with the diagnosis of HISTORY OF BREAST CANCER  The various methods of treatment have been discussed with the patient and family. After consideration of risks, benefits and other options for treatment, the patient has consented to  Procedure(s): LIPOFILLING FROM ABDOMEN TO LEFT CHEST (Left) REVISION OF LEFT BREAST RECONSTRUCTION WITH LEFT SALINE IMPLANT EXCHANGE AND ALODERM TO LEFT CHEST. (Left) as a surgical intervention .  The patient's history has been reviewed, patient examined, no change in status, stable for surgery.  I have reviewed the patient's chart and labs.  Questions were answered to the patient's satisfaction.     Finneas Mathe

## 2016-07-24 NOTE — Discharge Instructions (Signed)

## 2016-07-24 NOTE — Transfer of Care (Signed)
Immediate Anesthesia Transfer of Care Note  Patient: Tammy Gray  Procedure(s) Performed: Procedure(s): LIPOFILLING FROM ABDOMEN TO LEFT CHEST (Left) REVISION OF LEFT BREAST RECONSTRUCTION WITH LEFT SALINE IMPLANT EXCHANGE AND ALODERM TO LEFT CHEST (Left)  Patient Location: PACU  Anesthesia Type:General  Level of Consciousness: awake and sedated  Airway & Oxygen Therapy: Patient Spontanous Breathing and Patient connected to face mask oxygen  Post-op Assessment: Report given to RN and Post -op Vital signs reviewed and stable  Post vital signs: Reviewed and stable  Last Vitals:  Vitals:   07/24/16 0645  BP: 124/62  Pulse: 70  Resp: 18  Temp: 37.3 C    Last Pain:  Vitals:   07/24/16 0645  TempSrc: Oral         Complications: No apparent anesthesia complications

## 2016-07-24 NOTE — Anesthesia Postprocedure Evaluation (Signed)
Anesthesia Post Note  Patient: Luisa Dago  Procedure(s) Performed: Procedure(s) (LRB): LIPOFILLING FROM ABDOMEN TO LEFT CHEST (Left) REVISION OF LEFT BREAST RECONSTRUCTION WITH LEFT SALINE IMPLANT EXCHANGE AND ALODERM TO LEFT CHEST (Left)  Patient location during evaluation: PACU Anesthesia Type: General Level of consciousness: awake and alert Pain management: pain level controlled Vital Signs Assessment: post-procedure vital signs reviewed and stable Respiratory status: spontaneous breathing, nonlabored ventilation and respiratory function stable Cardiovascular status: blood pressure returned to baseline and stable Postop Assessment: no signs of nausea or vomiting Anesthetic complications: no       Last Vitals:  Vitals:   07/24/16 1015 07/24/16 1030  BP: 104/62 105/61  Pulse: 71 75  Resp: (!) 23 19  Temp:      Last Pain:  Vitals:   07/24/16 1030  TempSrc:   PainSc: 2                  Lynda Rainwater

## 2016-07-24 NOTE — Anesthesia Procedure Notes (Signed)
Procedure Name: Intubation Performed by: Terrance Mass Pre-anesthesia Checklist: Patient identified, Emergency Drugs available, Suction available and Patient being monitored Patient Re-evaluated:Patient Re-evaluated prior to inductionOxygen Delivery Method: Circle system utilized Preoxygenation: Pre-oxygenation with 100% oxygen Intubation Type: IV induction Ventilation: Mask ventilation without difficulty Laryngoscope Size: Miller and 2 Tube type: Oral Tube size: 7.0 mm Number of attempts: 1 Airway Equipment and Method: Stylet Placement Confirmation: ETT inserted through vocal cords under direct vision,  positive ETCO2 and breath sounds checked- equal and bilateral Secured at: 22 cm Tube secured with: Tape Dental Injury: Teeth and Oropharynx as per pre-operative assessment

## 2016-07-24 NOTE — Op Note (Signed)
Operative Note   DATE OF OPERATION: 4.6.18  LOCATION: [facility]-outpatient  SURGICAL DIVISION: Plastic Surgery  PREOPERATIVE DIAGNOSES:  1. History breast cancer 2. Acquired absence breast  POSTOPERATIVE DIAGNOSES:  same  PROCEDURE:  1. Revision left breast reconstruction with saline implant exchange, lipofilling from abdomen to left chest 2. Acellular dermis (Alloderm) 100cm2 to left chest   SURGEON: Irene Limbo MD MBA  ASSISTANT: none  ANESTHESIA:  General.   EBL: 50 ml  COMPLICATIONS: None immediate.   INDICATIONS FOR PROCEDURE:  The patient, Tammy Gray, is a 51 y.o. female born on August 07, 1965, is here for revision left breast reconstruction. She underwent skin reduction mastectomy with saline implant placement. She has developed inferior and lateral displacement implant and contour depression superiorly. Plan ADM reinforcement soft tissue to narrow pocket and fat grafting to left chest.   FINDINGS: Removed intact saline implant.New Natrelle Smooth Round High Profile Saline 650 ml implant placed in left chest, fill volume 650 ml. REF [id number]. 45 ml fat infiltrated over left chest.  DESCRIPTION OF PROCEDURE:  The patient's operative site was marked with the patient in the preoperative area to mark chest midline, sternal notch, desired anterior axillary line, as well as supraumbilical abdomen and bilateral flanks as donor site. The patientwas taken to the operating room. SCDs were placed and IV antibiotics were given. The patient's operative site was prepped and draped in a sterile fashion. A time out was performed and all information was confirmed to be correct.Stab incision made over bilateral lateral abdomen and tumescent fluid infiltrated over supraumbilical abdomen and bilateral flanks,total 400 ml tumescent infiltrated. Power assisted liposuction performed to endpoint symmetric contour and soft tissue thickness. The fat was then washed and prepared  by filtration using Revolve system. Incision made in left inframammary fold mastectomy scar and carried to capsule. Intact implant removed. Harvested fat was then infiltrated in subcutaneous plane throughout left mastectomy flap.   Alloderm Thick RTU was perforated and inset to chest all medial to desired anterior axillary line with 2-0 PDS from axilla to inframammary fold. 15 Fr JP drain placed in cavity and secured with 2-0 nylon. At this time left breast cavity irrigated with solution containing Ancef, gentamicin, and bacitracin, followed by Betadine.Implant prepared and placed in left breast cavity. Care taken to ensure proper orientation. Prior to filling the implant to final fill volume, the anterior border of acellular dermis was then draped over implant and secured to anterior capsule and chest wall at inframammary fold with 2-0 PDS. Additional 2- PDS placed between capsule and chest wall to set IMF. Implant filled to 650 ml. Fill tubing removed and care taken to ensure closure fill tab. Closure was completed with running 3-0 vicryl for approximation of capsule and superficial fascia. 4-0 vicryl was placed in dermis and running 4-0 monocryl was used to close skin.   Tissue adhesive applied to breast incision.The abdominal incisions were approximated with 4-0 monocryl. Dry dressing and abdominal and breast binders applied.   The patient was allowed to wake from anesthesia, extubated and taken to the recovery room in satisfactory condition.   SPECIMENS: none  DRAINS: 15 Fr JP in left breast reconstruction  Irene Limbo, MD [facility] [phone], pin [phone]

## 2016-07-27 ENCOUNTER — Encounter (HOSPITAL_BASED_OUTPATIENT_CLINIC_OR_DEPARTMENT_OTHER): Payer: Self-pay | Admitting: Plastic Surgery

## 2016-08-26 NOTE — Progress Notes (Signed)
CLINIC:  Survivorship   REASON FOR VISIT:  Routine follow-up post-treatment for a recent history of breast cancer.  BRIEF ONCOLOGIC HISTORY:    Breast cancer of upper-outer quadrant of left female breast ([place])   12/20/2015 Initial Biopsy    1.  Left breast needle core biopsy upper outer quadrant: high grade DCIS with calcifications, ER+(80%), PR+(90%) 2.  Left breast needle core biopsy, lower outer quadrant: high grade DCIS with calcifications and necrosis, ER+(90%), PR+(5%).        12/25/2015 Initial Diagnosis    Breast cancer of upper-outer quadrant of left female breast ([place])     12/2015 Genetic Testing    Genetic testing indicated and offered, however declined by patient.    Genes that were going to be tested include: ATM, BARD1, BRCA1, BRCA2, BRIP1, CDH1, CHEK2, FANCC, MLH1, MSH2, MSH6, NBN, PALB2, PMS2, PTEN, RAD51C, RAD51D, TP53, and XRCC2.  This panel also includes deletion/duplication analysis (without sequencing) for one gene, EPCAM.       12/2015 -  Anti-estrogen oral therapy    Tamoxifen daily      01/28/2016 Surgery    Left mastectomy [facility]): Left mastectomy, DCIS 2.1 cm, grade 3, margins negative, 4 SLN negative.        INTERVAL HISTORY:  Tammy Gray presents to the [facility] today for our initial meeting to review her survivorship care plan detailing her treatment course for breast cancer, as well as monitoring long-term side effects of that treatment, education regarding health maintenance, screening, and overall wellness and health promotion.     Overall, Tammy Gray reports doing well.  She underwent implant placement following her mastectomy, however this had to be done again in April due to her implant moving.  She is taking Tamoxifen daily and does feel bloated.  Last menses in January, 2018.  She sees Dr. Stann Mainland, her GYN annually.  She does not have any current gynecological issues.      REVIEW OF SYSTEMS:  Review of Systems  Constitutional:  Negative for appetite change, chills, diaphoresis, fever and unexpected weight change.  HENT:   Negative for hearing loss and lump/mass.   Eyes: Negative for eye problems and icterus.  Respiratory: Negative for chest tightness, cough and shortness of breath.   Cardiovascular: Negative for chest pain and leg swelling.  Gastrointestinal: Negative for abdominal distention and abdominal pain.  Endocrine: Negative for hot flashes.  Genitourinary: Negative for difficulty urinating, dyspareunia, menstrual problem, pelvic pain, vaginal bleeding and vaginal discharge.   Musculoskeletal: Negative for gait problem.  Skin: Negative for itching.  Neurological: Negative for dizziness, extremity weakness, gait problem, headaches and numbness.  Hematological: Negative for adenopathy. Does not bruise/bleed easily.  Psychiatric/Behavioral: Negative for depression. The patient is not nervous/anxious.   Breast: Denies any new nodularity, masses, tenderness, nipple changes, or nipple discharge.       ONCOLOGY TREATMENT TEAM:  1. Surgeon:  Dr. Barry Dienes at [facility] 2. Medical Oncologist: Dr. Jana Hakim 3. Plastic Surgeon: Dr. Iran Planas    PAST MEDICAL/SURGICAL HISTORY:  Past Medical History:  Diagnosis Date  . Breast cancer of upper-outer quadrant of left female breast (Tammy Gray) 12/25/2015  . Herpes 1987  . Hypertension   . IBS (irritable bowel syndrome)    Past Surgical History:  Procedure Laterality Date  . BREAST IMPLANT EXCHANGE Left 07/24/2016   Procedure: REVISION OF LEFT BREAST RECONSTRUCTION WITH LEFT SALINE IMPLANT EXCHANGE AND ALODERM TO LEFT CHEST;  Surgeon: Irene Limbo, MD;  Location: [place];  Service: Clinical cytogeneticist;  Laterality: Left;  . BREAST RECONSTRUCTION WITH PLACEMENT OF TISSUE EXPANDER AND FLEX HD (ACELLULAR HYDRATED DERMIS) Left 01/28/2016   Procedure: LEFT BREAST RECONSTRUCTION WITH PLACEMENT OF TISSUE EXPANDER AND CORTIVA;  Surgeon: Irene Limbo, MD;   Location: [place];  Service: Plastics;  Laterality: Left;  . CESAREAN SECTION    . LIPOSUCTION WITH LIPOFILLING Left 04/10/2016   Procedure: LIPOFILLING from abdomen to left chest;  Surgeon: Irene Limbo, MD;  Location: [place];  Service: Plastics;  Laterality: Left;  . LIPOSUCTION WITH LIPOFILLING Left 07/24/2016   Procedure: LIPOFILLING FROM ABDOMEN TO LEFT CHEST;  Surgeon: Irene Limbo, MD;  Location: [place];  Service: Plastics;  Laterality: Left;  Marland Kitchen MASTECTOMY W/ SENTINEL NODE BIOPSY Left 01/28/2016   Procedure: LEFT MASTECTOMY WITH SENTINEL LYMPH NODE BIOPSY;  Surgeon: Stark Klein, MD;  Location: [place];  Service: General;  Laterality: Left;  Marland Kitchen MASTOPEXY Right 04/10/2016   Procedure: MASTOPEXY;  Surgeon: Irene Limbo, MD;  Location: [place];  Service: Plastics;  Laterality: Right;  . REMOVAL OF TISSUE EXPANDER AND PLACEMENT OF IMPLANT Left 04/10/2016   Procedure: REMOVAL OF TISSUE EXPANDER AND PLACEMENT OF IMPLANT;  Surgeon: Irene Limbo, MD;  Location: [place];  Service: Plastics;  Laterality: Left;     ALLERGIES:  No Known Allergies   CURRENT MEDICATIONS:  Outpatient Encounter Prescriptions as of 08/27/2016  Medication Sig  . dicyclomine (BENTYL) 10 MG capsule Take 10 mg by mouth as needed for spasms.  . hydrochlorothiazide (MICROZIDE) 12.5 MG capsule Take 12.5 mg by mouth daily.  Marland Kitchen lisinopril (PRINIVIL,ZESTRIL) 5 MG tablet Take 5 mg by mouth daily.  Marland Kitchen loratadine (CLARITIN) 10 MG tablet Take 10 mg by mouth daily.  . Multiple Vitamins-Minerals (WOMENS MULTIVITAMIN PO) Take 1 tablet by mouth daily.  . tamoxifen (NOLVADEX) 20 MG tablet Take 1 tablet (20 mg total) by mouth daily.  . valACYclovir (VALTREX) 500 MG tablet Take 500 mg by mouth daily.   No facility-administered encounter medications on file as of 08/27/2016.      ONCOLOGIC FAMILY HISTORY:  Family  History  Problem Relation Age of Onset  . Other Mother 35       hx of hysterectomy to treat endometriosis and heavy bleeding  . Prostate cancer Father 50       unspecified Gleason score; +prostatectomy  . Colon cancer Paternal Grandmother        dx late 51s; w/ mets, d. 55  . Colon cancer Paternal Grandfather        dx. late 66s  . Stroke Maternal Grandmother   . Diverticulitis Maternal Grandmother   . Hodgkin's lymphoma Cousin        maternal 1st cousin dx. in his 20s-30s  . Breast cancer Cousin        paternal 1st cousin, once-removed (on PGF's side of family) dx late 36s     GENETIC COUNSELING/TESTING: Indicated, not done  SOCIAL HISTORY:  CHANTE MAYSON is single and lives with her sons in [place], [place].  She has 2 sons who are 68 and 3, one is living at home and is in high school, the older son is in college at [employer].  Ms. 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  She denies any current or history of tobacco, alcohol, or illicit drug use.     PHYSICAL EXAMINATION:  Vital Signs:   Vitals:  08/27/16 0826  BP: 135/63  Pulse: 83  Resp: 18  Temp: 98.4 F (36.9 C)   Filed Weights   08/27/16 0826  Weight: 167 lb 1.6 oz (75.8 kg)   General: Well-nourished, well-appearing female in no acute distress.  She is unaccompanied today.   HEENT: Head is normocephalic.  Pupils equal and reactive to [place]z. Conjunctivae clear without exudate.  Sclerae anicteric. Oral mucosa is pink, moist.  Oropharynx is pink without lesions or erythema.  Lymph: No cervical, supraclavicular, or infraclavicular lymphadenopathy noted on palpation.  Cardiovascular: Regular rate and rhythm.Marland Kitchen Respiratory: Clear to auscultation bilaterally. Chest expansion symmetric; breathing non-labored.  Breasts: right breast s/p mammoplasty, no nodules, masses, skin/nipple changes, left breast s/p mastectomy and implant placement, no swelling, nodules, skin changes,  benign bilateral breast exam.   GI: Abdomen soft and round; non-tender, non-distended. Bowel sounds normoactive.  GU: Deferred.  Neuro: No focal deficits. Steady gait.  Psych: Mood and affect normal and appropriate for situation.  Extremities: No edema. Skin: Warm and dry.  LABORATORY DATA:  None for this visit.  DIAGNOSTIC IMAGING:  None for this visit.      ASSESSMENT AND PLAN:  MsAnyeli Gray is a pleasant 51 y.o. female with Stage 0 left breastDCIS, ER+/PR+/HER2-, diagnosed in 12/2015, treated with mastectomy and anti estrogen therapy.  She presents to the Survivorship Clinic for our initial meeting and routine follow-up post-completion of treatment for breast cancer.    1. Stage 0 left breast cancer:  Ms. Done is continuing to recover from definitive treatment for breast cancer. She will follow-up with her surgeon, Dr. Barry Dienes in June, 2018, and her medical oncologist, Dr. Jana Hakim in 05/18/17 with history and physical exam per surveillance protocol.  She will continue her anti-estrogen therapy with Tamoxifen. Thus far, she is tolerating the Tamoxifen well, with minimal side effects. She was instructed to make Dr. Jana Hakim or myself aware if she begins to experience any worsening side effects of the medication and I could see her back in clinic to help manage those side effects, as needed. Though the incidence is low, there is an associated risk of endometrial cancer with anti-estrogen therapies like Tamoxifen.  Ms. Cura was encouraged to contact Dr. Jana Hakim or myself with any worsening of menses while taking Tamoxifen.  She does have f/u with Dr. Stann Mainland in August, 2018 as well.  Other side effects of Tamoxifen were again reviewed with her as well. Today, a comprehensive survivorship care plan and treatment summary was reviewed with the patient today detailing her breast cancer diagnosis, treatment course, potential late/long-term effects of treatment, appropriate follow-up care with  recommendations for the future, and patient education resources.  A copy of this summary, along with a letter will be sent to the patient's primary care provider via mail/fax/In Basket message after today's visit.    2. Lymphedema prevention: Today we reviewed that Keishawna has a 3-23% chance of getting lymphedema from her surgery and lymph node biopsy.  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  She and I reviewed lymphedema symptoms, early detection, and I prescribed a class one compression sleeve and glove for her flight.    3. Bone health:  Given Ms. Bueso's age/history of breast cancer, family history of osteoporosis she is at slight risk of bone demineralization.  She was encouraged to increase her consumption of foods rich in calcium, as well as increase her weight-bearing activities.  She was given education on specific activities to promote bone  health.  4. Cancer screening:  Due to Ms. Canty's history and her age, she should receive screening for skin cancers, colon cancer, and gynecologic cancers.  The information and recommendations are listed on the patient's comprehensive care plan/treatment summary and were reviewed in detail with the patient.    5. Health maintenance and wellness promotion: Ms. Mehlman was encouraged to consume 5-7 servings of fruits and vegetables per day. We reviewed the "Nutrition Rainbow" handout, as well as the handout "Take Control of Your Health and Reduce Your Cancer Risk" from the [place].  She was also encouraged to engage in moderate to vigorous exercise for 30 minutes per day most days of the week. We discussed the [employer] fitness program, which is designed for cancer survivors to help them become more physically fit after cancer treatments.  She was instructed to limit her alcohol consumption and continue to abstain from tobacco use.   6. Support services/counseling: It is not uncommon for this period of the patient's  cancer care trajectory to be one of many emotions and stressors.  We discussed an opportunity for her to participate in the next session of [facility] ("Finding Your New Normal") support group series designed for patients after they have completed treatment.   Ms. Lingenfelter was encouraged to take advantage of our many other support services programs, support groups, and/or counseling in coping with her new life as a cancer survivor after completing anti-cancer treatment.  She was offered support today through active listening and expressive supportive counseling.  She was given information regarding our available services and encouraged to contact me with any questions or for help enrolling in any of our support group/programs.    Dispo:   -Follow up with Dr. Barry Dienes in 09/2016 -Return to cancer center in 04/2017 as scheduled -She is welcome to return back to the Survivorship Clinic at any time; no additional follow-up needed at this time.  -Consider referral back to survivorship as a long-term survivor for continued surveillance  A total of (30) minutes of face-to-face time was spent with this patient with greater than 50% of that time in counseling and care-coordination.   Gardenia Phlegm, NP Survivorship Program [place] [phone]   Note: PRIMARY CARE PROVIDER Vania Rea, [place] [phone]

## 2016-08-27 ENCOUNTER — Ambulatory Visit (HOSPITAL_BASED_OUTPATIENT_CLINIC_OR_DEPARTMENT_OTHER): Payer: 59 | Admitting: Adult Health

## 2016-08-27 ENCOUNTER — Encounter: Payer: Self-pay | Admitting: Adult Health

## 2016-08-27 ENCOUNTER — Telehealth: Payer: Self-pay | Admitting: Oncology

## 2016-08-27 VITALS — BP 135/63 | HR 83 | Temp 98.4°F | Resp 18 | Ht 63.5 in | Wt 167.1 lb

## 2016-08-27 DIAGNOSIS — I10 Essential (primary) hypertension: Secondary | ICD-10-CM | POA: Diagnosis not present

## 2016-08-27 DIAGNOSIS — C50412 Malignant neoplasm of upper-outer quadrant of left female breast: Secondary | ICD-10-CM

## 2016-08-27 NOTE — Telephone Encounter (Signed)
lvm to inform ptof genetics appt 5/24 at 10 am per :LOS

## 2016-09-10 ENCOUNTER — Other Ambulatory Visit: Payer: 59

## 2016-09-10 ENCOUNTER — Ambulatory Visit: Payer: 59 | Admitting: Genetics

## 2016-09-10 DIAGNOSIS — Z8042 Family history of malignant neoplasm of prostate: Secondary | ICD-10-CM

## 2016-09-10 DIAGNOSIS — Z853 Personal history of malignant neoplasm of breast: Secondary | ICD-10-CM

## 2016-09-11 ENCOUNTER — Telehealth: Payer: Self-pay | Admitting: Genetics

## 2016-09-11 NOTE — Progress Notes (Signed)
REFERRING PROVIDER: Chauncey Cruel, MD [address] [place], [place] [address]  PRIMARY PROVIDER:  Vania Rea, MD  PRIMARY REASON FOR VISIT:  1. Personal history of breast cancer   2. Family history of prostate cancer     HISTORY OF PRESENT ILLNESS:   Tammy Gray, a 51 y.o. female, was previously seen for a [place] cancer genetics consultation on 01/07/2016 with Tammy Luz, MS, CGC at the request of Tammy Gray due to a personal and family history of cancer. Tammy Gray reports no changes to her family or personal history, aside from completion of her most of her breast cancer treatments, since her meeting with Tammy Gray in September 2017. Please refer to [place] prior cancer genetics clinic note for more information regarding Tammy Gray's medical, social and family histories, and her assessment and recommendations, at the time. Tammy Gray reports that she canceled her testing that was ordered in September 2017 because she did not feel like it was the right time to pursue testing given that she was dealing with so much related to her cancer treatments. She reports that today she is ready to pursue testing, given that she has completed much of her treatment.  CANCER HISTORY:    Breast cancer of upper-outer quadrant of left female breast ([place])   12/20/2015 Initial Biopsy    1.  Left breast needle core biopsy upper outer quadrant: high grade DCIS with calcifications, ER+(80%), PR+(90%) 2.  Left breast needle core biopsy, lower outer quadrant: high grade DCIS with calcifications and necrosis, ER+(90%), PR+(5%).        12/25/2015 Initial Diagnosis    Breast cancer of upper-outer quadrant of left female breast ([place])     12/2015 Genetic Testing    Genetic testing indicated and offered, however declined by patient.    Genes that were going to be tested include: ATM, BARD1, BRCA1, BRCA2, BRIP1, CDH1, CHEK2, FANCC, MLH1, MSH2, MSH6, NBN, PALB2, PMS2, PTEN, RAD51C, RAD51D, TP53, and XRCC2.   This panel also includes deletion/duplication analysis (without sequencing) for one gene, EPCAM.       12/2015 -  Anti-estrogen oral therapy    Tamoxifen daily      01/28/2016 Surgery    Left mastectomy [facility]): Left mastectomy, DCIS 2.1 cm, grade 3, margins negative, 4 SLN negative.        Ductal carcinoma in situ (DCIS) of left breast   02/12/2016 Initial Diagnosis    Ductal carcinoma in situ (DCIS) of left breast      Past Medical History:  Diagnosis Date  . Breast cancer of upper-outer quadrant of left female breast ([place]) 12/25/2015  . Herpes 1987  . Hypertension   . IBS (irritable bowel syndrome)     Past Surgical History:  Procedure Laterality Date  . BREAST IMPLANT EXCHANGE Left 07/24/2016   Procedure: REVISION OF LEFT BREAST RECONSTRUCTION WITH LEFT SALINE IMPLANT EXCHANGE AND ALODERM TO LEFT CHEST;  Surgeon: Tammy Limbo, MD;  Location: [facility];  Service: Plastics;  Laterality: Left;  . BREAST RECONSTRUCTION WITH PLACEMENT OF TISSUE EXPANDER AND FLEX HD (ACELLULAR HYDRATED DERMIS) Left 01/28/2016   Procedure: LEFT BREAST RECONSTRUCTION WITH PLACEMENT OF TISSUE EXPANDER AND CORTIVA;  Surgeon: Tammy Limbo, MD;  Location: [facility];  Service: Plastics;  Laterality: Left;  . CESAREAN SECTION    . LIPOSUCTION WITH LIPOFILLING Left 04/10/2016   Procedure: LIPOFILLING from abdomen to left chest;  Surgeon: Tammy Limbo, MD;  Location: [facility];  Service: Plastics;  Laterality: Left;  . LIPOSUCTION WITH LIPOFILLING Left 07/24/2016   Procedure: LIPOFILLING FROM ABDOMEN TO LEFT CHEST;  Surgeon: Tammy Limbo, MD;  Location: [facility];  Service: Plastics;  Laterality: Left;  Marland Kitchen MASTECTOMY W/ SENTINEL NODE BIOPSY Left 01/28/2016   Procedure: LEFT MASTECTOMY WITH SENTINEL LYMPH NODE BIOPSY;  Surgeon: Tammy Klein, MD;  Location: [facility];  Service: General;  Laterality: Left;  Marland Kitchen MASTOPEXY  Right 04/10/2016   Procedure: MASTOPEXY;  Surgeon: Tammy Limbo, MD;  Location: [facility];  Service: Plastics;  Laterality: Right;  . REMOVAL OF TISSUE EXPANDER AND PLACEMENT OF IMPLANT Left 04/10/2016   Procedure: REMOVAL OF TISSUE EXPANDER AND PLACEMENT OF IMPLANT;  Surgeon: Tammy Limbo, MD;  Location: [facility];  Service: Plastics;  Laterality: Left;    Social History   Social History  . Marital status: Divorced    Spouse name: N/A  . Number of children: N/A  . Years of education: N/A   Social History Main Topics  . Smoking status: Never Smoker  . Smokeless tobacco: Never Used  . Alcohol use No  . Drug use: No  . Sexual activity: Not on file   Other Topics Concern  . Not on file   Social History Narrative  . No narrative on file     FAMILY HISTORY:  We obtained a detailed, 4-generation family history.  Significant diagnoses are listed below: Family History  Problem Relation Age of Onset  . Other Mother 86       hx of hysterectomy to treat endometriosis and heavy bleeding  . Prostate cancer Father 57       unspecified Gleason score; +prostatectomy  . Colon cancer Paternal Grandmother        dx late 24s; w/ mets, d. 27  . Colon cancer Paternal Grandfather        dx. late 56s  . Stroke Maternal Grandmother   . Diverticulitis Maternal Grandmother   . Hodgkin's lymphoma Cousin        maternal 1st cousin dx. in his 20s-30s  . Breast cancer Cousin        paternal 1st cousin, once-removed (on PGF's side of family) dx late 43s   Ms. Lamoureaux has two sons, ages 10 and 18.  She has one full sister who is currently 82 and whom has never had cancer.  Her sister has one son who is 26 and cancer-free.  Ms. Spruiell mother and father are both currently 46.  Her mother has never been diagnosed with cancer, but does have a history of endometriosis and heavy bleeding which led to her having a hysterectomy at 34.  Ms. Foulk father was diagnosed  with prostate cancer at 72 (unspecified Gleason score) and this was treated with surgery.    Ms. Yaw mother had two full brothers.  One brother passed away at 107.  Ms. Bedonie reports that this uncle was a heavy drinker and also a smoker; he did not have cancer to her knowledge.  He had one son and one daughter, now ages 47 and 75, respectively.  His son was diagnosed with Hodgkin's lymphoma in his 66s-30s.  The other maternal uncle is currently 30 and also has never had cancer.  He has one son who is cancer-free.  Ms. Kareem maternal grandmother died of age-related causes at 45.  She had a history of strokes and diverticulitis, but was never diagnosed with cancer.  Ms. Swier maternal grandfather passed away in his  31s.  He had a history of smoking and also worked on a tobacco farm, but he did not have cancer to Ms. Percival's knowledge.  Ms. Zehner has no information for any maternal great aunts/uncles or great grandparents.    Ms. Rencher father was an only child.  His mother was diagnosed with colon cancer in her late 10s.  This metastasized and she passed away at 34.  She was adopted, so we do not have any information for her family.  Ms. Kozlov paternal grandfather was also diagnosed with colon cancer in his late 36s.  He underwent surgery and he did not pass away until the age of 57.  Ms. Olthoff has limited information for his siblings, but she does report that one of her father's cousins (a paternal first cousin, once-removed to her) was diagnosed with breast cancer in her late 110s.    Ms. Turvey is unaware of any previous family history of genetic testing for hereditary cancer.  Patient's maternal ancestors are of Caucasian and Native American/Cherokee descent, and paternal ancestors are of Caucasian descent. There is no reported Ashkenazi Jewish ancestry. There is no known consanguinity.  PLAN: After considering the risks, benefits, and limitations, Ms. Duggin  provided informed consent to pursue  genetic testing and the blood sample was sent to [facility] for analysis of the 46-gene Common Hereditary Cancers Panel which includes analysis of the following: APC, ATM, AXIN2, BARD1, BMPR1A, BRCA1, BRCA2, BRIP1, CDH1, CDKN2A, CHEK2, CTNNA1, DICER1, EPCAM, GREM1, HOXB13, KIT, MEN1, MLH1, MSH2, MSH3, MSH6, MUTYH, NBN, NF1, NTHL1, PALB2, PDGFRA, PMS2, POLD1, POLE, PTEN, RAD50, RAD51C, RAD51D, SDHA, SDHB, SDHC, SDHD, SMAD4, SMARCA4, STK11, TP53, TSC1, TSC2, and VHL. We discussed that this is a different panel and genetic testing company than what Ms. Levitan originally had ordered when she met with Kayla. Ms. Filsaime expressed agreement with testing through [facility] and this panel. Results should be available within approximately 3 weeks' time, at which point they will be disclosed by telephone to Ms. Tal, as will any additional recommendations warranted by these results. This information will also be available in Epic.   Lastly, we encouraged Ms. Mccumbers to remain in contact with cancer genetics annually so that we can continuously update the family history and inform her of any changes in cancer genetics and testing that may be of benefit for this family.   Ms.  Budreau questions were answered to her satisfaction today. Our contact information was provided should additional questions or concerns arise. Thank you for the referral and allowing [place] to share in the care of your patient.   Mal Misty, MS, [facility] Certified Naval [email or web address] phone: [phone]  The patient was seen for a total of 10 minutes in face-to-face genetic counseling.  This patient was discussed with Drs. Magrinat, Lindi Adie and/or Burr Medico who agrees with the above.    _______________________________________________________________________ For Office Staff:  Number of people involved in session: 1 Was an Intern/ student involved with case: no

## 2016-09-28 ENCOUNTER — Ambulatory Visit: Payer: Self-pay | Admitting: Genetics

## 2016-09-28 ENCOUNTER — Encounter: Payer: Self-pay | Admitting: Genetics

## 2016-09-28 DIAGNOSIS — Z1379 Encounter for other screening for genetic and chromosomal anomalies: Secondary | ICD-10-CM

## 2016-09-28 HISTORY — DX: Encounter for other screening for genetic and chromosomal anomalies: Z13.79

## 2016-09-28 NOTE — Telephone Encounter (Signed)
Reviewed that germline genetic testing revealed no pathogenic mutations. This is considered to be a negative result. Testing was performed through [facility]'s 46-gene Common Hereditary Cancers Panel. [facility]'s Common Hereditary Cancers Panel includes analysis of the following 46 genes: APC, ATM, AXIN2, BARD1, BMPR1A, BRCA1, BRCA2, BRIP1, CDH1, CDKN2A, CHEK2, CTNNA1, DICER1, EPCAM, GREM1, HOXB13, KIT, MEN1, MLH1, MSH2, MSH3, MSH6, MUTYH, NBN, NF1, NTHL1, PALB2, PDGFRA, PMS2, POLD1, POLE, PTEN, RAD50, RAD51C, RAD51D, SDHA, SDHB, SDHC, SDHD, SMAD4, SMARCA4, STK11, TP53, TSC1, TSC2, and VHL.  For more detailed discussion, please see genetic counseling documentation from 09/28/2016. Result report dated 09/18/2016.

## 2016-09-28 NOTE — Progress Notes (Signed)
HPI: Tammy Gray. Laborde was previously seen in the [place] clinic on 09/10/2016 due to a personal and family history of cancer and concerns regarding a hereditary predisposition to cancer. Please refer to our prior cancer genetics clinic note for more information regarding Tammy Gray. Nienow's medical, social and family histories, and our assessment and recommendations, at the time. Tammy Gray. Slappey recent genetic test results were disclosed to her, as were recommendations warranted by these results. These results and recommendations are discussed in more detail below.  CANCER HISTORY:    Breast cancer of upper-outer quadrant of left female breast ([place])   12/20/2015 Initial Biopsy    1.  Left breast needle core biopsy upper outer quadrant: high grade DCIS with calcifications, ER+(80%), PR+(90%) 2.  Left breast needle core biopsy, lower outer quadrant: high grade DCIS with calcifications and necrosis, ER+(90%), PR+(5%).        12/25/2015 Initial Diagnosis    Breast cancer of upper-outer quadrant of left female breast ([place])     12/2015 Genetic Testing    Genetic testing indicated and offered, however declined by patient.    Genes that were going to be tested include: ATM, BARD1, BRCA1, BRCA2, BRIP1, CDH1, CHEK2, FANCC, MLH1, MSH2, MSH6, NBN, PALB2, PMS2, PTEN, RAD51C, RAD51D, TP53, and XRCC2.  This panel also includes deletion/duplication analysis (without sequencing) for one gene, EPCAM.       12/2015 -  Anti-estrogen oral therapy    Tamoxifen daily      01/28/2016 Surgery    Left mastectomy [facility]): Left mastectomy, DCIS 2.1 cm, grade 3, margins negative, 4 SLN negative.        Ductal carcinoma in situ (DCIS) of left breast   02/12/2016 Initial Diagnosis    Ductal carcinoma in situ (DCIS) of left breast       FAMILY HISTORY:  We obtained a detailed, 4-generation family history.  Significant diagnoses are listed below: Family History  Problem Relation Age of Onset  . Other Mother 44         hx of hysterectomy to treat endometriosis and heavy bleeding  . Prostate cancer Father 72       unspecified Gleason score; +prostatectomy  . Colon cancer Paternal Grandmother        dx late 36s; w/ mets, d. 63  . Colon cancer Paternal Grandfather        dx. late 36s  . Stroke Maternal Grandmother   . Diverticulitis Maternal Grandmother   . Hodgkin's lymphoma Cousin        maternal 1st cousin dx. in his 20s-30s  . Breast cancer Cousin        paternal 1st cousin, once-removed (on PGF's side of family) dx late 38s   Tammy Gray. Dehoyos has two sons, ages 23 and 7. She has one full sister who is currently 59 and whom has never had cancer. Her sister has one son who is 66 and cancer-free. Tammy Gray. Inlow mother and father are both currently 47. Her mother has never been diagnosed with cancer, but does have a history of endometriosis and heavy bleeding which led to her having a hysterectomy at 39. Tammy Gray. Serafin father was diagnosed with prostate cancer at 64 (unspecified Gleason score) and this was treated with surgery.   Tammy Gray. Braniff mother had two full brothers. One brother passed away at 24. Tammy Gray. Pursifull reports that this uncle was a heavy drinker and also a smoker; he did not have cancer to her knowledge. He had one son and one daughter,  now ages 45 and 62, respectively. His son was diagnosed with Hodgkin's lymphoma in his 71s-30s. The other maternal uncle is currently 83 and also has never had cancer. He has one son who is cancer-free. Tammy Gray. Miguez maternal grandmother died of age-related causes at 24. She had a history of strokes and diverticulitis, but was never diagnosed with cancer. Tammy Gray. Rau maternal grandfather passed away in his 57s. He had a history of smoking and also worked on a tobacco farm, but he did not have cancer to Tammy Gray. Jakel's knowledge. Tammy Gray. Lanza has no information for any maternal great aunts/uncles or great grandparents.   Tammy Gray. Lai father was an only child. His  mother was diagnosed with colon cancer in her late 5s. This metastasized and she passed away at 15. She was adopted, so we do not have any information for her family. Tammy Gray. Hannay paternal grandfather was also diagnosed with colon cancer in his late 17s. He underwent surgery and he did not pass away until the age of 60. Tammy Gray. Custer has limited information for his siblings, but she does report that one of her father's cousins (a paternal first cousin, once-removed to her) was diagnosed with breast cancer in her late 78s.   Tammy Gray. Whitelaw is unaware of any previous family history of genetic testing for hereditary cancer. Patient's maternal ancestors are of Caucasian and Native American/Cherokeedescent, and paternal ancestors are of Caucasiandescent. There is noreported Ashkenazi Jewish ancestry. There is noknown consanguinity.  GENETIC TEST RESULTS: Genetic testing performed through [facility]'s Common Hereditary Cancers Panel reported out on 09/18/2016 showed no pathogenic mutations. [facility]'s Common Hereditary Cancers Panel includes analysis of the following 46 genes: APC, ATM, AXIN2, BARD1, BMPR1A, BRCA1, BRCA2, BRIP1, CDH1, CDKN2A, CHEK2, CTNNA1, DICER1, EPCAM, GREM1, HOXB13, KIT, MEN1, MLH1, MSH2, MSH3, MSH6, MUTYH, NBN, NF1, NTHL1, PALB2, PDGFRA, PMS2, POLD1, POLE, PTEN, RAD50, RAD51C, RAD51D, SDHA, SDHB, SDHC, SDHD, SMAD4, SMARCA4, STK11, TP53, TSC1, TSC2, and VHL.  The test report will be scanned into EPIC and will be located under the Molecular Pathology section of the Results Review tab.A portion of the result report is included below for reference.    Since the current genetic testing is not perfect, it is possible there may be a gene mutation in one of these genes that current testing cannot detect, but that chance is small. It is also possible that another gene that has not yet been discovered, or that we have not yet tested, is responsible for the cancer diagnoses in the family. Therefore,  important to remain in touch with cancer genetics in the future so that we can continue to offer Tammy Gray. Steuber the most up to date genetic testing.   CANCER SCREENING RECOMMENDATIONS: This result indicates that it is unlikely Tammy Gray. Sensing has an increased risk for a future cancer due to a mutation in one of these genes. Because no causative or actionable mutations were identified, it is recommended she continue to follow the cancer management and screening guidelines provided by her oncology and primary healthcare provider.   RECOMMENDATIONS FOR FAMILY MEMBERS: Women in this family might be at some increased risk of developing cancer, over the general population risk, simply due to the family history of cancer. We recommended women in this family have a yearly mammogram beginning at age 42, or 64 years younger than the earliest onset of cancer, an annual clinical breast exam, and perform monthly breast self-exams. Women in this family should also have a gynecological exam as recommended by their primary provider. All  family members should have a colonoscopy by age 17.  FOLLOW-UP: Cancer genetics is a rapidly advancing field and it is possible that new genetic tests will be appropriate for Tammy Gray. Iglehart and/or her family members in the future. We encouraged her to remain in contact with cancer genetics on an annual basis so we can update her personal and family histories and let her know of advances in cancer genetics that may benefit this family.   Our contact number was provided. Tammy Gray. Ake questions were answered to her satisfaction, and she knows she is welcome to call us at anytime with additional questions or concerns.   Tammy Misty, Tammy Gray, [email or web address]

## 2016-10-29 ENCOUNTER — Other Ambulatory Visit: Payer: Self-pay | Admitting: General Surgery

## 2016-10-29 DIAGNOSIS — Z853 Personal history of malignant neoplasm of breast: Secondary | ICD-10-CM

## 2016-10-29 DIAGNOSIS — Z1231 Encounter for screening mammogram for malignant neoplasm of breast: Secondary | ICD-10-CM

## 2016-12-14 ENCOUNTER — Other Ambulatory Visit: Payer: Self-pay | Admitting: General Surgery

## 2016-12-14 NOTE — H&P (Signed)
History of Present Illness Leighton Ruff MD; 5/88/5027 3:17 PM) The patient is a 51 year old female who presents with breast cancer. Pt is s/p left mastectomy and SLN bx 01/28/2016 for left breast DCIS followed by reconstruction with Dr. Iran Planas. She does not have family history of breast cancer, but has a father with melanoma and prostate cancer and 2 grandparents with history of colon cancer. She is taking tamoxifen for her breast cancer.    Problem List/Past Medical Leighton Ruff, MD; 7/41/2878 3:19 PM) MALIGNANT NEOPLASM OF OVERLAPPING SITES OF LEFT BREAST IN FEMALE, ESTROGEN RECEPTOR POSITIVE (C50.812) HISTORY OF LEFT BREAST CANCER (Z85.3)  Past Surgical History Leighton Ruff, MD; 6/76/7209 3:19 PM) Breast Biopsy Left. Cesarean Section - Multiple Oral Surgery  Diagnostic Studies History Leighton Ruff, MD; 4/70/9628 3:19 PM) Colonoscopy never Mammogram within last year Pap Smear 1-5 years ago  Allergies Leighton Ruff, MD; 3/66/2947 3:19 PM) No Known Drug Allergies 02/17/2016 Allergies Reconciled  Medication History Leighton Ruff, MD; 6/54/6503 3:19 PM) Claritin (10MG  Capsule, Oral) Active. Multivitamin Adult (Oral) Active. Jolessa (0.15-0.03MG  Tablet, Oral) Active. Medications Reconciled HydroCHLOROthiazide (12.5MG  Tablet, Oral) Active. Lisinopril (5MG  Tablet, Oral) Active. Tamoxifen Citrate (20MG  Tablet, Oral) Active. ValACYclovir HCl (500MG  Tablet, Oral as needed) Active.  Social History Leighton Ruff, MD; 5/46/5681 3:19 PM) Caffeine use Coffee, Tea. No alcohol use No drug use Tobacco use Never smoker.  Family History Leighton Ruff, MD; 2/75/1700 3:19 PM) Arthritis Mother. Cerebrovascular Accident Family Members In [place] Members In General. Diabetes Mellitus Family Members In General. Hypertension Father. Ischemic Bowel Disease Family Members In General, Mother, Sister. Melanoma Father. Prostate  Cancer Father. Seizure disorder Family Members In General.  Pregnancy / Birth History Leighton Ruff, MD; 1/74/9449 3:19 PM) Age at menarche 49 years. Contraceptive History Oral contraceptives. Gravida 2 Maternal age 24-30 Para 2 Regular periods  Other Problems Leighton Ruff, MD; 6/75/9163 3:19 PM) Breast Cancer Gastroesophageal Reflux Disease Hemorrhoids High blood pressure Lump In Breast SCREENING FOR COLORECTAL CANCER (Z12.11, Z12.12)    Vitals Claiborne Billings Dockery LPN; 8/46/6599 3:57 PM) 12/14/2016 2:50 PM Weight: 165.5 lb Height: 63in Body Surface Area: 1.78 m Body Mass Index: 29.32 kg/m  Temp.: 98.86F(Oral)  Pulse: 74 (Regular)  BP: 120/68 (Sitting, Left Arm, Standard)      Physical Exam Leighton Ruff MD; 0/17/7939 3:19 PM)  General Mental Status-Alert. General Appearance-Not in acute distress. Build & Nutrition-Well nourished. Posture-Normal posture. Gait-Normal.  Head and Neck Head-normocephalic, atraumatic with no lesions or palpable masses. Trachea-midline.  Chest and Lung Exam Chest and lung exam reveals -on auscultation, normal breath sounds, no adventitious sounds and normal vocal resonance.  Cardiovascular Cardiovascular examination reveals -normal heart sounds, regular rate and rhythm with no murmurs and no digital clubbing, cyanosis, edema, increased warmth or tenderness.  Abdomen Inspection Inspection of the abdomen reveals - No Hernias. Palpation/Percussion Palpation and Percussion of the abdomen reveal - Soft, Non Tender, No Rigidity (guarding), No hepatosplenomegaly and No Palpable abdominal masses.  Neurologic Neurologic evaluation reveals -alert and oriented x 3 with no impairment of recent or remote memory, normal attention span and ability to concentrate, normal sensation and normal coordination.  Musculoskeletal Normal Exam - Bilateral-Upper Extremity Strength Normal and Lower Extremity  Strength Normal.    Assessment & Plan Leighton Ruff MD; 0/30/0923 3:15 PM)  SCREENING FOR COLORECTAL CANCER (Z12.11) Impression: 51 year old female who presents to the office for evaluation for screening colonoscopy. On exam I do not see any contraindications to IV sedation. We have discussed colonoscopy and detail  and she is familiar with the procedure from taking her parents to receive these. We will get her instructions for a bowel prep today and get her colonoscopy scheduled. Risk of bleeding and perforation and missed pathology and inability to complete procedure were discussed. I believe she understands this and agrees to proceed.

## 2016-12-24 ENCOUNTER — Ambulatory Visit
Admission: RE | Admit: 2016-12-24 | Discharge: 2016-12-24 | Disposition: A | Discharge status: Home / Self Care | Payer: 59 | Source: Ambulatory Visit | Attending: General Surgery | Admitting: General Surgery

## 2016-12-24 DIAGNOSIS — Z1231 Encounter for screening mammogram for malignant neoplasm of breast: Secondary | ICD-10-CM

## 2016-12-25 ENCOUNTER — Ambulatory Visit: Payer: 59

## 2017-02-11 ENCOUNTER — Ambulatory Visit (HOSPITAL_COMMUNITY)
Admission: RE | Admit: 2017-02-11 | Discharge: 2017-02-11 | Disposition: A | Discharge status: Home / Self Care | Payer: 59 | Source: Ambulatory Visit | Attending: General Surgery | Admitting: General Surgery

## 2017-02-11 ENCOUNTER — Encounter (HOSPITAL_COMMUNITY): Payer: Self-pay | Admitting: *Deleted

## 2017-02-11 ENCOUNTER — Encounter (HOSPITAL_COMMUNITY)
Admission: RE | Disposition: A | Discharge status: Home / Self Care | Payer: Self-pay | Source: Ambulatory Visit | Attending: General Surgery

## 2017-02-11 DIAGNOSIS — Z800 Family history of malignant neoplasm of digestive organs: Secondary | ICD-10-CM | POA: Insufficient documentation

## 2017-02-11 DIAGNOSIS — Z79810 Long term (current) use of selective estrogen receptor modulators (SERMs): Secondary | ICD-10-CM | POA: Insufficient documentation

## 2017-02-11 DIAGNOSIS — Z86000 Personal history of in-situ neoplasm of breast: Secondary | ICD-10-CM | POA: Diagnosis not present

## 2017-02-11 DIAGNOSIS — Z1211 Encounter for screening for malignant neoplasm of colon: Secondary | ICD-10-CM | POA: Insufficient documentation

## 2017-02-11 DIAGNOSIS — Z79899 Other long term (current) drug therapy: Secondary | ICD-10-CM | POA: Insufficient documentation

## 2017-02-11 DIAGNOSIS — Z9012 Acquired absence of left breast and nipple: Secondary | ICD-10-CM | POA: Diagnosis not present

## 2017-02-11 HISTORY — PX: COLONOSCOPY: SHX5424

## 2017-02-11 SURGERY — COLONOSCOPY
Anesthesia: Moderate Sedation

## 2017-02-11 MED ORDER — LACTATED RINGERS IV SOLN: INTRAVENOUS | Status: DC

## 2017-02-11 MED ORDER — FENTANYL CITRATE (PF) 100 MCG/2ML IJ SOLN
INTRAMUSCULAR | Status: AC
  Filled 2017-02-11: qty 2

## 2017-02-11 MED ORDER — FENTANYL CITRATE (PF) 100 MCG/2ML IJ SOLN
INTRAMUSCULAR | Status: DC | PRN
  Administered 2017-02-11 (×4): 25 ug via INTRAVENOUS

## 2017-02-11 MED ORDER — MIDAZOLAM HCL 2 MG/2ML IJ SOLN
INTRAMUSCULAR | Status: DC | PRN
  Administered 2017-02-11: 1 mg via INTRAVENOUS
  Administered 2017-02-11 (×2): 2 mg via INTRAVENOUS
  Administered 2017-02-11 (×2): 1 mg via INTRAVENOUS
  Administered 2017-02-11: 2 mg via INTRAVENOUS

## 2017-02-11 MED ORDER — MIDAZOLAM HCL 5 MG/ML IJ SOLN
INTRAMUSCULAR | Status: AC
  Filled 2017-02-11: qty 3

## 2017-02-11 MED ORDER — SODIUM CHLORIDE 0.9 % IV SOLN
INTRAVENOUS | Status: DC
  Administered 2017-02-11: 500 mL via INTRAVENOUS

## 2017-02-11 NOTE — Discharge Instructions (Signed)
Post Colonoscopy Instructions ° °1. DIET: Follow a [place] bland diet the first 24 hours after arrival home, such as soup, liquids, crackers, etc.  Be sure to include lots of fluids daily.  Avoid fast food or heavy meals as your are more likely to get nauseated.   °2. You may have some mild rectal bleeding for the first few days after the procedure.  This should get less and less with time.  Resume any blood thinners 2 days after your procedure unless directed otherwise by your physician. °3. Take your usually prescribed home medications unless otherwise directed. °a. If you have any pain, it is helpful to get up and walk around, as it is usually from excess gas. °b. If this is not helpful, you can take an over-the-counter pain medication.  Choose one of the following that works best for you: °i. Naproxen (Aleve, etc)  Two 220mg tabs twice a day °ii. Ibuprofen (Advil, etc) Three 200mg tabs four times a day (every meal & bedtime) °iii. If you still have pain after using one of these, please call the office °4. It is normal to not have a bowel movement for 2-3 days after colonoscopy.   ° °5. ACTIVITIES as tolerated:   °6. You may resume regular (Needle) daily activities beginning the next day--such as daily self-care, walking, climbing stairs--gradually increasing activities as tolerated.  ° ° °WHEN TO CALL US [phone]: °1. Fever over 101.5 F (38.5 C)  °2. Severe abdominal or chest pain  °3. Large amount of rectal bleeding, passing multiple blood clots  °4. Dizziness or shortness of breath °5. Increasing nausea or vomiting ° ° The clinic staff is available to answer your questions during regular business hours (8:30am-5pm).  Please don’t hesitate to call and ask to speak to one of our nurses for clinical concerns.  ° If you have a medical emergency, go to the nearest emergency room or call 911. ° A surgeon from [facility] is always on call at the hospitals ° ° °[facility] °[address], [address], [place], [place]  [address] ? °MAIN: [phone] ? TOLL FREE: [phone] ?  °FAX [phone] °[email or web address] ° ° °

## 2017-02-11 NOTE — H&P (Signed)
The patient is a 51 year old female who was seen in the office by my partner for breast cancer. Pt is s/p left mastectomy and SLN bx 01/28/2016 for left breast DCIS followed by reconstruction with Dr. Iran Planas. She does have 2 grandparents with history of colon cancer. She is taking tamoxifen for her breast cancer. She denies any abdominal pain, bleeding or changes in her bowel habits.   Problem List/Past Medical   HISTORY OF LEFT BREAST CANCER (Z85.3)  Past Surgical History  Breast Biopsy Left. Cesarean Section - Multiple Oral Surgery  Diagnostic Studies History  Colonoscopy never Mammogram within last year Pap Smear 1-5 years ago  Allergies  No Known Drug Allergies  Allergies Reconciled  Medication History  Claritin (10MG  Capsule, Oral) Active. Multivitamin Adult (Oral) Active. Jolessa (0.15-0.03MG  Tablet, Oral) Active. Medications Reconciled HydroCHLOROthiazide (12.5MG  Tablet, Oral) Active. Lisinopril (5MG  Tablet, Oral) Active. Tamoxifen Citrate (20MG  Tablet, Oral) Active. ValACYclovir HCl (500MG  Tablet, Oral as needed) Active.  Social History  Caffeine use Coffee, Tea. No alcohol use No drug use Tobacco use Never smoker.  Family History  Arthritis Mother. Cerebrovascular Accident Family Members In [place] Members In General. Diabetes Mellitus Family Members In General. Hypertension Father. Ischemic Bowel Disease Family Members In General, Mother, Sister. Melanoma Father. Prostate Cancer Father. Seizure disorder Family Members In General.  Pregnancy / Birth History  Age at menarche 63 years. Contraceptive History Oral contraceptives. Gravida 2 Maternal age 62-30 Para 2 Regular periods  Other Problems  Breast Cancer Gastroesophageal Reflux Disease Hemorrhoids High blood pressure Lump In Breast SCREENING FOR COLORECTAL CANCER (Z12.11, Z12.12)  BP 140/69   Pulse 78   Temp  97.6 F (36.4 C) (Oral)   Resp 20   Ht 5' 3.5" (1.613 m)   Wt 75.8 kg (167 lb)   LMP 10/17/2016   SpO2 100%   BMI 29.12 kg/m        Physical Exam Leighton Ruff MD; 5/40/9811 3:19 PM)  General Mental Status-Alert. General Appearance-Not in acute distress. Build & Nutrition-Well nourished. Posture-Normal posture. Gait-Normal.  Head and Neck Head-normocephalic, atraumatic with no lesions or palpable masses. Trachea-midline.  Chest and Lung Exam Chest and lung exam reveals -on auscultation, normal breath sounds, no adventitious sounds and normal vocal resonance.  Cardiovascular Cardiovascular examination reveals -normal heart sounds, regular rate and rhythm with no murmurs and no digital clubbing, cyanosis, edema, increased warmth or tenderness.  Abdomen Inspection Inspection of the abdomen reveals - No Hernias. Palpation/Percussion Palpation and Percussion of the abdomen reveal - Soft, Non Tender, No Rigidity (guarding), No hepatosplenomegaly and No Palpable abdominal masses.  Neurologic Neurologic evaluation reveals -alert and oriented x 3 with no impairment of recent or remote memory, normal attention span and ability to concentrate, normal sensation and normal coordination.  Musculoskeletal Normal Exam - Bilateral-Upper Extremity Strength Normal and Lower Extremity Strength Normal.    Assessment & Plan  SCREENING FOR COLORECTAL CANCER (Z12.11) Impression: 51 year old female who presents to the office for evaluation for screening colonoscopy. On exam I do not see any contraindications to IV sedation. We have discussed colonoscopy and detail and she is familiar with the procedure from taking her parents to receive these. We will get her instructions for a bowel prep today and get her colonoscopy scheduled. Risk of bleeding and perforation and missed pathology and inability to complete procedure were discussed. I believe she  understands this and agrees to proceed.

## 2017-02-11 NOTE — Op Note (Signed)
[facility] Patient Name: Tammy Gray Procedure Date: 02/11/2017 MRN: [id number] Attending MD: Leighton Ruff , MD Date of Birth: 10-16-1965 CSN: [id number] Age: 51 Admit Type: Outpatient Procedure:                Colonoscopy Indications:              Colon cancer screening in patient at increased                            risk: Family history of colorectal cancer in                            multiple 2nd degree relatives Providers:                Leighton Ruff, MD, Zenon Mayo, RN, Alan Mulder,                            Technician Referring MD:              Medicines:                Midazolam 9 mg IV, Fentanyl 100 micrograms IV Complications:            No immediate complications. Estimated Blood Loss:     Estimated blood loss: none. Procedure:                Pre-Anesthesia Assessment:                           - Prior to the procedure, a History and Physical                            was performed, and patient medications and                            allergies were reviewed. The patient's tolerance of                            previous anesthesia was also reviewed. The risks                            and benefits of the procedure and the sedation                            options and risks were discussed with the patient.                            All questions were answered, and informed consent                            was obtained. Prior Anticoagulants: The patient has                            taken no previous anticoagulant or antiplatelet  agents. ASA Grade Assessment: II - A patient with                            mild systemic disease. After reviewing the risks                            and benefits, the patient was deemed in                            satisfactory condition to undergo the procedure.                           After obtaining informed consent, the colonoscope                            was passed  under direct vision. Throughout the                            procedure, the patient's blood pressure, pulse, and                            oxygen saturations were monitored continuously. The                            EC-[id number] ([id number]) scope was introduced through                            the anus and advanced to the the cecum, identified                            by the appendiceal orifice, IC valve and                            transillumination. The colonoscopy was technically                            difficult and complex due to significant looping.                            Successful completion of the procedure was aided by                            increasing the dose of sedation medication,                            changing the patient to a supine position, using                            manual pressure, withdrawing and reinserting the                            scope and straightening and shortening the scope to  obtain bowel loop reduction. The terminal ileum and                            the appendiceal orifice were photographed. The                            quality of the bowel preparation was excellent. Scope In: 10:54:48 AM Scope Out: 11:45:38 AM Scope Withdrawal Time: 0 hours 12 minutes 58 seconds  Total Procedure Duration: 0 hours 50 minutes 50 seconds  Findings:      The perianal and digital rectal examinations were normal. Pertinent       negatives include normal sphincter tone.      The entire examined colon appeared normal. Impression:               - The entire examined colon is normal.                           - No specimens collected. Moderate Sedation:      Moderate (conscious) sedation was administered by the endoscopy nurse       and supervised by the endoscopist. The following parameters were       monitored: oxygen saturation, heart rate, blood pressure, and response       to care. Recommendation:           -  Discharge patient to home (ambulatory).                           - Written discharge instructions were provided to                            the patient.                           - Advance diet as tolerated.                           - Continue present medications.                           - Repeat colonoscopy in 10 years for screening                            purposes.                           - Return to my office PRN.                           - Patient has a contact number available for                            emergencies. The signs and symptoms of potential                            delayed complications were discussed with the  patient. Return to normal activities tomorrow.                            Written discharge instructions were provided to the                            patient.                           - Return to normal activities tomorrow. Procedure Code(s):        --- Professional ---                           F4142, Colorectal cancer screening; colonoscopy on                            individual not meeting criteria for high risk Diagnosis Code(s):        --- Professional ---                           Z80.0, Family history of malignant neoplasm of                            digestive organs CPT copyright 2016 [employer]. All rights reserved. The codes documented in this report are preliminary and upon coder review may  be revised to meet current compliance requirements. Leighton Ruff, MD Leighton Ruff, MD 39/53/2023 11:59:35 AM This report has been signed electronically. Number of Addenda: 0

## 2017-02-12 ENCOUNTER — Encounter (HOSPITAL_COMMUNITY): Payer: Self-pay | Admitting: General Surgery

## 2017-05-10 NOTE — Progress Notes (Signed)
[place]  [phone] [phone]     ID: Tammy Gray DOB: 1965-08-25  MR#: [id number]  XYB#:[id number]  Patient Care Team: Vania Rea, MD as PCP - General (Obstetrics and Gynecology) Stark Klein, MD as Consulting Physician (General Surgery) Reita Shindler, Virgie Dad, MD as Consulting Physician (Oncology) Kyung Rudd, MD as Consulting Physician (Radiation Oncology) Danella Sensing, MD as Consulting Physician (Dermatology) Irene Limbo, MD as Consulting Physician (Plastic Surgery) Delice Bison, Charlestine Massed, NP as Nurse Practitioner (Hematology and Oncology) OTHER MD:  CHIEF COMPLAINT: Ductal carcinoma in situ  CURRENT TREATMENT: Tamoxifen   BREAST CANCER HISTORY: From the original intake note:  Saryna had routine screening mammography at [facility] 12/05/2015. There were calcifications in the left breast which were felt to warrant further evaluation. Accordingly on 12/16/2015 she underwent left diagnostic mammography at the [facility]. This showed the breast density to be category C. In the lateral aspect of the left breast there was an area of 3 mm of course calcifications and 4 cm lateral to this there is an area of developing punctate calcifications measuring 4.7 cm.  Biopsy of these 2 areas was performed 12/20/2015. Both showed high-grade ductal carcinoma in situ, both were estrogen receptor positive (80-90%) and progesterone receptor positive (5-90%), all these being with moderate staining intensity.  Her subsequent history is as detailed below  INTERVAL HISTORY: Treena returns today for follow-up of her ductal carcinoma in situ. She continues on tamoxifen, with good tolerance. She has occasional hot flashes at night as well as an increase in vaginal dryness, which is tolerable. She has menstrual cycles every 6 months with her last being the week prior to christmas. Her last menstrual cycle prior to that was in June 2018.     Since her last visit to the office, she underwent unilateral right screening mammography with tomography at [facility] on 12/24/2016 with results showing: Breast density category C. No mammographic evidence of malignancy. Genetic testing completed on 09/10/2016 with results showing: Negative result. No pathogenic sequence variants or deletions identified. She also had a screening colonoscopy completed on 02/11/2017 with results of: The entire examined colon is normal. No specimens collected.    REVIEW OF SYSTEMS: Dominigue reports that for exercise she walks 2.5 miles 5 days a week either on the treadmill or outside when the weather permits. She also gets in 10,000 steps a day. She continues with adequate hydration. She takes Bentyl for IBS. She denies unusual headaches, visual changes, nausea, vomiting, or dizziness. There has been no unusual cough, phlegm production, or pleurisy. This been no change in bowel or bladder habits. She denies unexplained fatigue or unexplained weight loss, bleeding, rash, or fever. A detailed review of systems was otherwise stable.    PAST MEDICAL HISTORY: Past Medical History:  Diagnosis Date  . Breast cancer of upper-outer quadrant of left female breast ([place]) 12/25/2015  . Genetic testing 09/28/2016   Ms. Scaffidi underwent genetic counseling and testing for hereditary cancer syndromes on 09/10/2016. Her results were negative for mutations in all 46 genes analyzed by [facility]'s 46-gene Common Hereditary Cancers Panel. Genes analyzed include: APC, ATM, AXIN2, BARD1, BMPR1A, BRCA1, BRCA2, BRIP1, CDH1, CDKN2A, CHEK2, CTNNA1, DICER1, EPCAM, GREM1, HOXB13, KIT, MEN1, MLH1, MSH2, MSH3, MSH6, MUTYH, NBN,   . Herpes 1987  . Hypertension   . IBS (irritable bowel syndrome)     PAST SURGICAL HISTORY: Past Surgical History:  Procedure Laterality Date  . BREAST IMPLANT EXCHANGE Left 07/24/2016   Procedure: REVISION  OF LEFT BREAST RECONSTRUCTION WITH LEFT SALINE IMPLANT EXCHANGE  AND ALODERM TO LEFT CHEST;  Surgeon: Irene Limbo, MD;  Location: [facility];  Service: Plastics;  Laterality: Left;  . BREAST RECONSTRUCTION WITH PLACEMENT OF TISSUE EXPANDER AND FLEX HD (ACELLULAR HYDRATED DERMIS) Left 01/28/2016   Procedure: LEFT BREAST RECONSTRUCTION WITH PLACEMENT OF TISSUE EXPANDER AND CORTIVA;  Surgeon: Irene Limbo, MD;  Location: [facility];  Service: Plastics;  Laterality: Left;  . CESAREAN SECTION    . COLONOSCOPY N/A 02/11/2017   Procedure: SCREENING COLONOSCOPY;  Surgeon: Leighton Ruff, MD;  Location: [facility];  Service: Endoscopy;  Laterality: N/A;  . LIPOSUCTION WITH LIPOFILLING Left 04/10/2016   Procedure: LIPOFILLING from abdomen to left chest;  Surgeon: Irene Limbo, MD;  Location: [facility];  Service: Plastics;  Laterality: Left;  . LIPOSUCTION WITH LIPOFILLING Left 07/24/2016   Procedure: LIPOFILLING FROM ABDOMEN TO LEFT CHEST;  Surgeon: Irene Limbo, MD;  Location: [facility];  Service: Plastics;  Laterality: Left;  Marland Kitchen MASTECTOMY Left   . MASTECTOMY W/ SENTINEL NODE BIOPSY Left 01/28/2016   Procedure: LEFT MASTECTOMY WITH SENTINEL LYMPH NODE BIOPSY;  Surgeon: Stark Klein, MD;  Location: [facility];  Service: General;  Laterality: Left;  Marland Kitchen MASTOPEXY Right 04/10/2016   Procedure: MASTOPEXY;  Surgeon: Irene Limbo, MD;  Location: [facility];  Service: Plastics;  Laterality: Right;  . REDUCTION MAMMAPLASTY Right 2017  . REMOVAL OF TISSUE EXPANDER AND PLACEMENT OF IMPLANT Left 04/10/2016   Procedure: REMOVAL OF TISSUE EXPANDER AND PLACEMENT OF IMPLANT;  Surgeon: Irene Limbo, MD;  Location: [facility];  Service: Plastics;  Laterality: Left;    FAMILY HISTORY Family History  Problem Relation Age of Onset  . Other Mother 55       hx of hysterectomy to treat endometriosis and heavy bleeding  . Prostate cancer Father 64        unspecified Gleason score; +prostatectomy  . Colon cancer Paternal Grandmother        dx late 44s; w/ mets, d. 33  . Colon cancer Paternal Grandfather        dx. late 24s  . Stroke Maternal Grandmother   . Diverticulitis Maternal Grandmother   . Hodgkin's lymphoma Cousin        maternal 1st cousin dx. in his 20s-30s  . Breast cancer Cousin        paternal 1st cousin, once-removed (on PGF's side of family) dx late 45s  The patient's father is 9 as of September 2017. He has a history of prostate cancer diagnosed age 18. The patient's mother is also alive, also 97 as of September 2017. On the paternal side a grandfather had colon cancer and a grandmother also colon cancer. There is no history of breast or ovarian cancer in the family to the patient's knowledge.  GYNECOLOGIC HISTORY:  No LMP recorded. She still having regular periods, and was on oral contraceptives until the time of this diagnosis, when this was stopped.  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 She is divorced. At home are her 2 sons [name], age 77, graduating in business at [employer] May 2019, and [name], 18, currently a high school senior    ADVANCED DIRECTIVES: Not in place. At the 01/01/2016 visit the patient was given the appropriate documents to complete and notarize at her discretion  HEALTH MAINTENANCE: Social History   Tobacco Use  . Smoking status: Never Smoker  . Smokeless tobacco: Never Used  Substance Use Topics  . Alcohol use: No  . Drug use: No     Colonoscopy:Never  PAP: Up-to-date  Bone density: Never   No Known Allergies  Current Outpatient Medications  Medication Sig Dispense Refill  . acetaminophen (TYLENOL) 500 MG tablet Take 500 mg by mouth every 6 (six) hours as needed (for pain.).    Marland Kitchen dicyclomine (BENTYL) 10 MG capsule Take 10 mg by mouth 3 (three) times daily as needed (for IBS  symptoms.).     Marland Kitchen hydrochlorothiazide (HYDRODIURIL) 12.5 MG tablet Take 12.5 mg by mouth daily.    Marland Kitchen ibuprofen (ADVIL,MOTRIN) 200 MG tablet Take 400 mg by mouth every 8 (eight) hours as needed (for pain.).    Marland Kitchen lisinopril (PRINIVIL,ZESTRIL) 5 MG tablet Take 5 mg by mouth daily.    Marland Kitchen loratadine (CLARITIN) 10 MG tablet Take 10 mg by mouth daily.    . Multiple Vitamin (MULTIVITAMIN WITH MINERALS) TABS tablet Take 1 tablet by mouth at bedtime.    . tamoxifen (NOLVADEX) 20 MG tablet Take 1 tablet (20 mg total) by mouth daily. 90 tablet 3  . valACYclovir (VALTREX) 500 MG tablet Take 500 mg by mouth at bedtime.     No current facility-administered medications for this visit.     OBJECTIVE: Middle-aged white woman in no acute distress  Vitals:   05/18/17 1530  BP: 136/68  Pulse: (!) 107  Resp: 19  Temp: 98.6 F (37 C)  SpO2: 92%     Body mass index is 29.78 kg/m.    ECOG FS:1 - Symptomatic but completely ambulatory  Sclerae unicteric, pupils round and equal Oropharynx clear and moist No cervical or supraclavicular adenopathy Lungs no rales or rhonchi Heart regular rate and rhythm Abd soft, nontender, positive bowel sounds MSK no focal spinal tenderness, no upper extremity lymphedema Neuro: nonfocal, well oriented, appropriate affect Breasts: The right breast is benign.  The left breast is status post mastectomy with reconstruction.  There is no evidence of local recurrence.  Both axillae are benign.   RESULTS:  CMP     Component Value Date/Time   NA 138 07/21/2016 1159   NA 139 01/01/2016 1213   K 4.1 07/21/2016 1159   K 3.6 01/01/2016 1213   CL 103 07/21/2016 1159   CO2 27 07/21/2016 1159   CO2 25 01/01/2016 1213   GLUCOSE 127 (H) 07/21/2016 1159   GLUCOSE 120 01/01/2016 1213   BUN 14 07/21/2016 1159   BUN 18.1 01/01/2016 1213   CREATININE 0.65 07/21/2016 1159   CREATININE 0.8 01/01/2016 1213   CALCIUM 9.1 07/21/2016 1159   CALCIUM 9.5 01/01/2016 1213   PROT 7.6  01/01/2016 1213   ALBUMIN 4.0 01/01/2016 1213   AST 19 01/01/2016 1213   ALT 32 01/01/2016 1213   ALKPHOS 22 (L) 01/01/2016 1213   BILITOT 0.39 01/01/2016 1213   GFRNONAA >60 07/21/2016 1159   GFRAA >60 07/21/2016 1159    INo results found for: SPEP, UPEP  Lab Results  Component Value Date   WBC 5.9 01/01/2016   NEUTROABS 3.3 01/01/2016   HGB 14.8 01/01/2016   HCT 44.6 01/01/2016   MCV 85.6 01/01/2016   PLT 258 01/01/2016      Chemistry      Component Value Date/Time   NA 138 07/21/2016 1159   NA 139 01/01/2016 1213   K 4.1 07/21/2016 1159  K 3.6 01/01/2016 1213   CL 103 07/21/2016 1159   CO2 27 07/21/2016 1159   CO2 25 01/01/2016 1213   BUN 14 07/21/2016 1159   BUN 18.1 01/01/2016 1213   CREATININE 0.65 07/21/2016 1159   CREATININE 0.8 01/01/2016 1213      Component Value Date/Time   CALCIUM 9.1 07/21/2016 1159   CALCIUM 9.5 01/01/2016 1213   ALKPHOS 22 (L) 01/01/2016 1213   AST 19 01/01/2016 1213   ALT 32 01/01/2016 1213   BILITOT 0.39 01/01/2016 1213       No results found for: LABCA2  No components found for: LABCA125  No results for input(s): INR in the last 168 hours.  Urinalysis No results found for: COLORURINE, APPEARANCEUR, LABSPEC, PHURINE, GLUCOSEU, HGBUR, BILIRUBINUR, KETONESUR, PROTEINUR, UROBILINOGEN, NITRITE, LEUKOCYTESUR   STUDIES: Since her last visit to the office, she underwent unilateral right screening mammography with tomography at [facility] on 12/24/2016 with results showing: Breast density category C. No mammographic evidence of malignancy.    ELIGIBLE FOR AVAILABLE RESEARCH PROTOCOL: no  ASSESSMENT: 52 y.o. [place] woman, status post left breast upper outer quadrant biopsy 12/20/2015 for ductal carcinoma in situ, grade 3, estrogen and progesterone receptor positive.  (1) genetics testing 01/07/2016 through the [facility] Panel through [facility] including testing for ATM, BARD1,  BRCA1, BRCA2, BRIP1, CDH1, CHEK2, FANCC, MLH1, MSH2, MSH6, NBN, PALB2, PMS2, PTEN, RAD51C, RAD51D, TP53, and XRCC2 was canceled by the patient  (2) status post left mastectomy and sentinel lymph node sampling 01/28/2016 for a pTis pN0, stage 0 ductal carcinoma in situ, grade 3, with close but negative margins.   (3) no need for adjuvant radiation per discussion at multidisciplinary conference 02/12/2016  4) tamoxifen started 01/01/2016   PLAN: Dazia is now just over a year out from definitive surgery for her breast cancer with no evidence of disease recurrence.  This is favorable.  She is tolerating tamoxifen well and the plan is to continue that for a total of 5 years.  She understands that tamoxifen is not a contraceptive.  She is still having periods although very irregularly.  She is going to see me again in October after her September mammogram and from that point I will continue to see her on a once a year basis.  I commended her for excellent exercise program  She knows to call for any other issues that may develop before the next visit here.  Bartlett Enke, Virgie Dad, MD  05/18/17 4:11 PM Medical Oncology and Hematology [facility] [address] [place], [place] [address] Tel. [phone]    Fax. [phone]    This document serves as a record of services personally performed by Lurline Del, MD. It was created on his behalf by Steva Colder, a trained medical scribe. The creation of this record is based on the scribe's personal observations and the provider's statements to them.   I have reviewed the above documentation for accuracy and completeness, and I agree with the above.

## 2017-05-18 ENCOUNTER — Inpatient Hospital Stay: Payer: BLUE CROSS/BLUE SHIELD | Attending: Oncology | Admitting: Oncology

## 2017-05-18 ENCOUNTER — Telehealth: Payer: Self-pay | Admitting: Oncology

## 2017-05-18 VITALS — BP 136/68 | HR 107 | Temp 98.6°F | Resp 19 | Ht 63.5 in | Wt 170.8 lb

## 2017-05-18 DIAGNOSIS — K589 Irritable bowel syndrome without diarrhea: Secondary | ICD-10-CM | POA: Diagnosis not present

## 2017-05-18 DIAGNOSIS — R232 Flushing: Secondary | ICD-10-CM | POA: Insufficient documentation

## 2017-05-18 DIAGNOSIS — I10 Essential (primary) hypertension: Secondary | ICD-10-CM | POA: Diagnosis not present

## 2017-05-18 DIAGNOSIS — Z9012 Acquired absence of left breast and nipple: Secondary | ICD-10-CM | POA: Insufficient documentation

## 2017-05-18 DIAGNOSIS — Z90710 Acquired absence of both cervix and uterus: Secondary | ICD-10-CM | POA: Diagnosis not present

## 2017-05-18 DIAGNOSIS — Z8042 Family history of malignant neoplasm of prostate: Secondary | ICD-10-CM | POA: Diagnosis not present

## 2017-05-18 DIAGNOSIS — C50412 Malignant neoplasm of upper-outer quadrant of left female breast: Secondary | ICD-10-CM

## 2017-05-18 DIAGNOSIS — D0512 Intraductal carcinoma in situ of left breast: Secondary | ICD-10-CM | POA: Diagnosis not present

## 2017-05-18 DIAGNOSIS — Z807 Family history of other malignant neoplasms of lymphoid, hematopoietic and related tissues: Secondary | ICD-10-CM | POA: Diagnosis not present

## 2017-05-18 DIAGNOSIS — Z79810 Long term (current) use of selective estrogen receptor modulators (SERMs): Secondary | ICD-10-CM | POA: Diagnosis not present

## 2017-05-18 DIAGNOSIS — Z803 Family history of malignant neoplasm of breast: Secondary | ICD-10-CM | POA: Insufficient documentation

## 2017-05-18 DIAGNOSIS — Z800 Family history of malignant neoplasm of digestive organs: Secondary | ICD-10-CM | POA: Diagnosis not present

## 2017-05-18 DIAGNOSIS — Z170 Estrogen receptor positive status [ER+]: Secondary | ICD-10-CM

## 2017-05-18 DIAGNOSIS — Z79899 Other long term (current) drug therapy: Secondary | ICD-10-CM | POA: Diagnosis not present

## 2017-05-18 DIAGNOSIS — Z9882 Breast implant status: Secondary | ICD-10-CM

## 2017-05-18 MED ORDER — TAMOXIFEN CITRATE 20 MG PO TABS: 20.0000 mg | ORAL_TABLET | Freq: Every day | ORAL | 3 refills | Status: DC

## 2017-05-18 NOTE — Telephone Encounter (Signed)
Patient declined AVS and calendar of upcoming October appointments. Patient is scheduled per 1/29 los.

## 2018-01-06 ENCOUNTER — Other Ambulatory Visit: Payer: Self-pay | Admitting: General Surgery

## 2018-01-06 DIAGNOSIS — Z1231 Encounter for screening mammogram for malignant neoplasm of breast: Secondary | ICD-10-CM

## 2018-01-18 ENCOUNTER — Telehealth: Payer: Self-pay | Admitting: Oncology

## 2018-01-18 NOTE — Telephone Encounter (Signed)
Called patient regarding march

## 2018-01-31 ENCOUNTER — Ambulatory Visit: Payer: BLUE CROSS/BLUE SHIELD | Admitting: Oncology

## 2018-02-01 ENCOUNTER — Ambulatory Visit
Admission: RE | Admit: 2018-02-01 | Discharge: 2018-02-01 | Disposition: A | Discharge status: Home / Self Care | Payer: 59 | Source: Ambulatory Visit | Attending: General Surgery | Admitting: General Surgery

## 2018-02-01 ENCOUNTER — Encounter: Payer: Self-pay | Admitting: Radiology

## 2018-02-01 DIAGNOSIS — Z1231 Encounter for screening mammogram for malignant neoplasm of breast: Secondary | ICD-10-CM

## 2018-06-08 ENCOUNTER — Telehealth: Payer: Self-pay | Admitting: Oncology

## 2018-06-08 NOTE — Telephone Encounter (Signed)
[facility] CME 3/9 -  Moved appointments from 3/9 to 3/31. Left message. Schedule mailed.

## 2018-06-27 ENCOUNTER — Ambulatory Visit: Payer: BLUE CROSS/BLUE SHIELD | Admitting: Oncology

## 2018-06-27 ENCOUNTER — Other Ambulatory Visit: Payer: BLUE CROSS/BLUE SHIELD

## 2018-07-19 ENCOUNTER — Inpatient Hospital Stay: Payer: 59

## 2018-07-19 ENCOUNTER — Inpatient Hospital Stay: Payer: 59 | Admitting: Oncology

## 2018-09-26 ENCOUNTER — Other Ambulatory Visit: Payer: Self-pay

## 2018-09-26 DIAGNOSIS — C50412 Malignant neoplasm of upper-outer quadrant of left female breast: Secondary | ICD-10-CM

## 2018-09-27 ENCOUNTER — Inpatient Hospital Stay (HOSPITAL_BASED_OUTPATIENT_CLINIC_OR_DEPARTMENT_OTHER): Payer: 59 | Admitting: Oncology

## 2018-09-27 ENCOUNTER — Inpatient Hospital Stay: Payer: 59 | Attending: Oncology

## 2018-09-27 ENCOUNTER — Other Ambulatory Visit: Payer: Self-pay

## 2018-09-27 VITALS — BP 139/59 | HR 72 | Temp 98.5°F | Resp 18 | Ht 63.5 in | Wt 154.0 lb

## 2018-09-27 DIAGNOSIS — D0512 Intraductal carcinoma in situ of left breast: Secondary | ICD-10-CM | POA: Insufficient documentation

## 2018-09-27 DIAGNOSIS — F39 Unspecified mood [affective] disorder: Secondary | ICD-10-CM | POA: Diagnosis not present

## 2018-09-27 DIAGNOSIS — R232 Flushing: Secondary | ICD-10-CM | POA: Insufficient documentation

## 2018-09-27 DIAGNOSIS — Z79810 Long term (current) use of selective estrogen receptor modulators (SERMs): Secondary | ICD-10-CM | POA: Insufficient documentation

## 2018-09-27 DIAGNOSIS — Z9012 Acquired absence of left breast and nipple: Secondary | ICD-10-CM

## 2018-09-27 DIAGNOSIS — Z800 Family history of malignant neoplasm of digestive organs: Secondary | ICD-10-CM

## 2018-09-27 DIAGNOSIS — I10 Essential (primary) hypertension: Secondary | ICD-10-CM | POA: Insufficient documentation

## 2018-09-27 DIAGNOSIS — Z170 Estrogen receptor positive status [ER+]: Secondary | ICD-10-CM | POA: Diagnosis not present

## 2018-09-27 DIAGNOSIS — Z79899 Other long term (current) drug therapy: Secondary | ICD-10-CM

## 2018-09-27 DIAGNOSIS — C50412 Malignant neoplasm of upper-outer quadrant of left female breast: Secondary | ICD-10-CM

## 2018-09-27 DIAGNOSIS — Z803 Family history of malignant neoplasm of breast: Secondary | ICD-10-CM | POA: Insufficient documentation

## 2018-09-27 DIAGNOSIS — Z791 Long term (current) use of non-steroidal anti-inflammatories (NSAID): Secondary | ICD-10-CM | POA: Diagnosis not present

## 2018-09-27 LAB — CMP (CANCER CENTER ONLY)
ALT: 30 U/L (ref 0–44)
AST: 20 U/L (ref 15–41)
Albumin: 4.2 g/dL (ref 3.5–5.0)
Alkaline Phosphatase: 30 U/L — ABNORMAL LOW (ref 38–126)
Anion gap: 12 (ref 5–15)
BUN: 17 mg/dL (ref 6–20)
CO2: 27 mmol/L (ref 22–32)
Calcium: 9.5 mg/dL (ref 8.9–10.3)
Chloride: 105 mmol/L (ref 98–111)
Creatinine: 0.82 mg/dL (ref 0.44–1.00)
GFR, Est AFR Am: >60 mL/min (ref >60)
GFR, Estimated: >60 mL/min (ref >60)
Glucose, Bld: 106 mg/dL — ABNORMAL HIGH (ref 70–99)
Potassium: 3.6 mmol/L (ref 3.5–5.1)
Sodium: 144 mmol/L (ref 135–145)
Total Bilirubin: <0.2 mg/dL — ABNORMAL LOW (ref 0.3–1.2)
Total Protein: 7.2 g/dL (ref 6.5–8.1)

## 2018-09-27 LAB — CBC WITH DIFFERENTIAL (CANCER CENTER ONLY)
Abs Immature Granulocytes: 0.02 10*3/uL (ref 0.00–0.07)
Basophils Absolute: 0 10*3/uL (ref 0.0–0.1)
Basophils Relative: 1 %
Eosinophils Absolute: 0.1 10*3/uL (ref 0.0–0.5)
Eosinophils Relative: 2 %
HCT: 42.5 % (ref 36.0–46.0)
Hemoglobin: 13.7 g/dL (ref 12.0–15.0)
Immature Granulocytes: 0 %
Lymphocytes Relative: 36 %
Lymphs Abs: 2 10*3/uL (ref 0.7–4.0)
MCH: 29 pg (ref 26.0–34.0)
MCHC: 32.2 g/dL (ref 30.0–36.0)
MCV: 89.9 fL (ref 80.0–100.0)
Monocytes Absolute: 0.4 10*3/uL (ref 0.1–1.0)
Monocytes Relative: 8 %
Neutro Abs: 2.9 10*3/uL (ref 1.7–7.7)
Neutrophils Relative %: 53 %
Platelet Count: 193 10*3/uL (ref 150–400)
RBC: 4.73 MIL/uL (ref 3.87–5.11)
RDW: 12.7 % (ref 11.5–15.5)
WBC Count: 5.4 10*3/uL (ref 4.0–10.5)
nRBC: 0 % (ref 0.0–0.2)

## 2018-09-27 MED ORDER — VENLAFAXINE HCL ER 37.5 MG PO CP24: 37.5000 mg | ORAL_CAPSULE | Freq: Every day | ORAL | 4 refills | Status: DC

## 2018-09-27 MED ORDER — TAMOXIFEN CITRATE 20 MG PO TABS: 20.0000 mg | ORAL_TABLET | Freq: Every day | ORAL | 3 refills | Status: DC

## 2018-09-27 NOTE — Progress Notes (Signed)
[place]  [phone] Fax:([phone]) [phone]     ID: Tammy Gray DOB: 1965/05/07  MR#: [id number]  BLT#:[id number]  Patient Care Team: Vania Rea, MD as PCP - General (Obstetrics and Gynecology) Keri Tavella, Virgie Dad, MD as Consulting Physician (Oncology) Danella Sensing, MD as Consulting Physician (Dermatology) Irene Limbo, MD as Consulting Physician (Plastic Surgery) Delice Bison, Charlestine Massed, NP as Nurse Practitioner (Hematology and Oncology) OTHER MD:  CHIEF COMPLAINT: Ductal carcinoma in situ  CURRENT TREATMENT: Tamoxifen   INTERVAL HISTORY: Tammy Gray returns today for follow-up of her ductal carcinoma in situ.   She continues on tamoxifen, with good tolerance. She reports hot flashes. She also notes some vaginal discharge, but this does not bother her and may actually be helping as she may be starting to go through menopause. She has not had a period since January 2019.  Since her last visit, she underwent right screening mammography with tomography at [facility] on 02/01/2018 showing: breast density category C; no evidence of malignancy.    REVIEW OF SYSTEMS: Tammy Gray reports she is more emotional over the last year and a half, and she's not sure if she is going into menopause or if there's just been a lot happening. She was working from home prior to [employer]. 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 Her two sons are home with her. Her mother had a stroke in the last few months, so Tammy Gray and her sister have been taking care of her. For exercise, she makes sure to get in at least 10,000 steps per day. She does 30-45 minutes of heavy cardio 5 days a week as well as some home weights and resistance bands.  The patient denies unusual headaches, visual changes, nausea, vomiting, stiff neck, dizziness, or gait imbalance. There has been no cough, phlegm production, or  pleurisy, no chest pain or pressure, and no change in bowel or bladder habits. The patient denies fever, rash, bleeding, unexplained fatigue or unexplained weight loss. A detailed review of systems was otherwise entirely negative.   BREAST CANCER HISTORY: From the original intake note:  Tammy Gray had routine screening mammography at [facility] 12/05/2015. There were calcifications in the left breast which were felt to warrant further evaluation. Accordingly on 12/16/2015 she underwent left diagnostic mammography at the [facility]. This showed the breast density to be category C. In the lateral aspect of the left breast there was an area of 3 mm of course calcifications and 4 cm lateral to this there is an area of developing punctate calcifications measuring 4.7 cm.  Biopsy of these 2 areas was performed 12/20/2015. Both showed high-grade ductal carcinoma in situ, both were estrogen receptor positive (80-90%) and progesterone receptor positive (5-90%), all these being with moderate staining intensity.  Her subsequent history is as detailed below   PAST MEDICAL HISTORY: Past Medical History:  Diagnosis Date  . Breast cancer of upper-outer quadrant of left female breast ([place]) 12/25/2015  . Genetic testing 09/28/2016   Ms. Wuebker underwent genetic counseling and testing for hereditary cancer syndromes on 09/10/2016. Her results were negative for mutations in all 46 genes analyzed by [facility]'s 46-gene Common Hereditary Cancers Panel. Genes analyzed include: APC, ATM, AXIN2, BARD1, BMPR1A, BRCA1, BRCA2, BRIP1, CDH1, CDKN2A, CHEK2, CTNNA1, DICER1, EPCAM, GREM1, HOXB13, KIT, MEN1, MLH1, MSH2, MSH3, MSH6, MUTYH, NBN,   . Herpes 1987  . Hypertension   . IBS (irritable  bowel syndrome)     PAST SURGICAL HISTORY: Past Surgical History:  Procedure Laterality Date  . BREAST IMPLANT EXCHANGE Left 07/24/2016   Procedure: REVISION OF LEFT BREAST RECONSTRUCTION WITH LEFT SALINE IMPLANT EXCHANGE AND  ALODERM TO LEFT CHEST;  Surgeon: Irene Limbo, MD;  Location: [place];  Service: Plastics;  Laterality: Left;  . BREAST RECONSTRUCTION WITH PLACEMENT OF TISSUE EXPANDER AND FLEX HD (ACELLULAR HYDRATED DERMIS) Left 01/28/2016   Procedure: LEFT BREAST RECONSTRUCTION WITH PLACEMENT OF TISSUE EXPANDER AND CORTIVA;  Surgeon: Irene Limbo, MD;  Location: [place];  Service: Plastics;  Laterality: Left;  . CESAREAN SECTION    . COLONOSCOPY N/A 02/11/2017   Procedure: SCREENING COLONOSCOPY;  Surgeon: Leighton Ruff, MD;  Location: [facility];  Service: Endoscopy;  Laterality: N/A;  . LIPOSUCTION WITH LIPOFILLING Left 04/10/2016   Procedure: LIPOFILLING from abdomen to left chest;  Surgeon: Irene Limbo, MD;  Location: [place];  Service: Plastics;  Laterality: Left;  . LIPOSUCTION WITH LIPOFILLING Left 07/24/2016   Procedure: LIPOFILLING FROM ABDOMEN TO LEFT CHEST;  Surgeon: Irene Limbo, MD;  Location: [place];  Service: Plastics;  Laterality: Left;  Marland Kitchen MASTECTOMY Left   . MASTECTOMY W/ SENTINEL NODE BIOPSY Left 01/28/2016   Procedure: LEFT MASTECTOMY WITH SENTINEL LYMPH NODE BIOPSY;  Surgeon: Stark Klein, MD;  Location: [place];  Service: General;  Laterality: Left;  Marland Kitchen MASTOPEXY Right 04/10/2016   Procedure: MASTOPEXY;  Surgeon: Irene Limbo, MD;  Location: [place];  Service: Plastics;  Laterality: Right;  . REDUCTION MAMMAPLASTY Right 2017  . REMOVAL OF TISSUE EXPANDER AND PLACEMENT OF IMPLANT Left 04/10/2016   Procedure: REMOVAL OF TISSUE EXPANDER AND PLACEMENT OF IMPLANT;  Surgeon: Irene Limbo, MD;  Location: [place];  Service: Plastics;  Laterality: Left;    FAMILY HISTORY Family History  Problem Relation Age of Onset  . Other Mother 4       hx of hysterectomy to treat endometriosis and heavy bleeding  . Prostate cancer Father 60        unspecified Gleason score; +prostatectomy  . Colon cancer Paternal Grandmother        dx late 42s; w/ mets, d. 28  . Colon cancer Paternal Grandfather        dx. late 28s  . Stroke Maternal Grandmother   . Diverticulitis Maternal Grandmother   . Hodgkin's lymphoma Cousin        maternal 1st cousin dx. in his 20s-30s  . Breast cancer Cousin        paternal 1st cousin, once-removed (on PGF's side of family) dx late 63s  The patient's father is 59 as of September 2017. He has a history of prostate cancer diagnosed age 4. The patient's mother is also alive, also 20 as of 09/2018. On the paternal side a grandfather had colon cancer and a grandmother also colon cancer. There is no history of breast or ovarian cancer in the family to the patient's knowledge.   GYNECOLOGIC HISTORY:  Patient's last menstrual period was 05/04/2017 (within weeks). She was on oral contraceptives until the time of this diagnosis, when this was stopped. She has not had a period since January 2019.  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 She is divorced. At home are her 2 sons [name], age 45, works as a Counsellor at [employer] since graduating from [employer] in business in May 2019, and Thomasena Edis, age 54, is home from college for the summer.    ADVANCED DIRECTIVES: Not in place. At the 01/01/2016 visit the patient was given the appropriate documents to complete and notarize at her discretion   HEALTH MAINTENANCE: Social History   Tobacco Use  . Smoking status: Never Smoker  . Smokeless tobacco: Never Used  Substance Use Topics  . Alcohol use: No  . Drug use: No     Colonoscopy: 01/2017, normal  PAP: Up-to-date  Bone density: Never   No Known Allergies  Current Outpatient Medications  Medication Sig Dispense Refill  . acetaminophen (TYLENOL) 500 MG tablet Take 500 mg by mouth every 6 (six) hours as  needed (for pain.).    Marland Kitchen hydrochlorothiazide (HYDRODIURIL) 12.5 MG tablet Take 12.5 mg by mouth daily.    Marland Kitchen ibuprofen (ADVIL,MOTRIN) 200 MG tablet Take 400 mg by mouth every 8 (eight) hours as needed (for pain.).    Marland Kitchen lisinopril (PRINIVIL,ZESTRIL) 5 MG tablet Take 5 mg by mouth daily.    Marland Kitchen loratadine (CLARITIN) 10 MG tablet Take 10 mg by mouth daily.    . Multiple Vitamin (MULTIVITAMIN WITH MINERALS) TABS tablet Take 1 tablet by mouth at bedtime.    . tamoxifen (NOLVADEX) 20 MG tablet Take 1 tablet (20 mg total) by mouth daily. 90 tablet 3  . valACYclovir (VALTREX) 500 MG tablet Take 500 mg by mouth at bedtime.    Marland Kitchen venlafaxine XR (EFFEXOR-XR) 37.5 MG 24 hr capsule Take 1 capsule (37.5 mg total) by mouth daily with breakfast. 90 capsule 4   No current facility-administered medications for this visit.     OBJECTIVE: Middle-aged white woman who appears stated age  67:   09/27/18 1540  BP: (!) 139/59  Pulse: 72  Resp: 18  Temp: 98.5 F (36.9 C)  SpO2: 97%     Body mass index is 26.85 kg/m.    ECOG FS:1 - Symptomatic but completely ambulatory  Sclerae unicteric, EOMs intact Mask No cervical or supraclavicular adenopathy Lungs no rales or rhonchi Heart regular rate and rhythm Abd soft, nontender, positive bowel sounds MSK no focal spinal tenderness, no upper extremity lymphedema Neuro: nonfocal, well oriented, appropriate affect Breasts: The right breast is unremarkable.  The left breast is status post mastectomy and reconstruction.  There is no evidence of local recurrence.  Both axillae are benign.  RESULTS:  CMP     Component Value Date/Time   NA 144 09/27/2018 1517   NA 139 01/01/2016 1213   K 3.6 09/27/2018 1517   K 3.6 01/01/2016 1213   CL 105 09/27/2018 1517   CO2 27 09/27/2018 1517   CO2 25 01/01/2016 1213   GLUCOSE 106 (H) 09/27/2018 1517   GLUCOSE 120 01/01/2016 1213   BUN 17 09/27/2018 1517   BUN 18.1 01/01/2016 1213   CREATININE 0.82 09/27/2018 1517    CREATININE 0.8 01/01/2016 1213   CALCIUM 9.5 09/27/2018 1517   CALCIUM 9.5 01/01/2016 1213   PROT 7.2 09/27/2018 1517   PROT 7.6 01/01/2016 1213   ALBUMIN 4.2 09/27/2018 1517   ALBUMIN 4.0 01/01/2016 1213   AST 20 09/27/2018 1517   AST 19 01/01/2016 1213   ALT 30 09/27/2018 1517   ALT 32 01/01/2016 1213   ALKPHOS 30 (L) 09/27/2018 1517   ALKPHOS 22 (L) 01/01/2016 1213   BILITOT <0.2 (L) 09/27/2018 1517   BILITOT 0.39 01/01/2016 1213   GFRNONAA >60 09/27/2018 1517  GFRAA >60 09/27/2018 1517    INo results found for: SPEP, UPEP  Lab Results  Component Value Date   WBC 5.4 09/27/2018   NEUTROABS 2.9 09/27/2018   HGB 13.7 09/27/2018   HCT 42.5 09/27/2018   MCV 89.9 09/27/2018   PLT 193 09/27/2018      Chemistry      Component Value Date/Time   NA 144 09/27/2018 1517   NA 139 01/01/2016 1213   K 3.6 09/27/2018 1517   K 3.6 01/01/2016 1213   CL 105 09/27/2018 1517   CO2 27 09/27/2018 1517   CO2 25 01/01/2016 1213   BUN 17 09/27/2018 1517   BUN 18.1 01/01/2016 1213   CREATININE 0.82 09/27/2018 1517   CREATININE 0.8 01/01/2016 1213      Component Value Date/Time   CALCIUM 9.5 09/27/2018 1517   CALCIUM 9.5 01/01/2016 1213   ALKPHOS 30 (L) 09/27/2018 1517   ALKPHOS 22 (L) 01/01/2016 1213   AST 20 09/27/2018 1517   AST 19 01/01/2016 1213   ALT 30 09/27/2018 1517   ALT 32 01/01/2016 1213   BILITOT <0.2 (L) 09/27/2018 1517   BILITOT 0.39 01/01/2016 1213       No results found for: LABCA2  No components found for: LABCA125  No results for input(s): INR in the last 168 hours.  Urinalysis No results found for: COLORURINE, APPEARANCEUR, LABSPEC, PHURINE, GLUCOSEU, HGBUR, BILIRUBINUR, KETONESUR, PROTEINUR, UROBILINOGEN, NITRITE, LEUKOCYTESUR   STUDIES: No results found.   ELIGIBLE FOR AVAILABLE RESEARCH PROTOCOL: no  ASSESSMENT: 53 y.o. [place] woman, status post left breast upper outer quadrant biopsy 12/20/2015 for ductal carcinoma in  situ, grade 3, estrogen and progesterone receptor positive.  (1) genetics testing 01/07/2016 through the [place] Panel through [facility] including testing for ATM, BARD1, BRCA1, BRCA2, BRIP1, CDH1, CHEK2, FANCC, MLH1, MSH2, MSH6, NBN, PALB2, PMS2, PTEN, RAD51C, RAD51D, TP53, and XRCC2 was canceled by the patient  (2) status post left mastectomy and sentinel lymph node sampling 01/28/2016 for a pTis pN0, stage 0 ductal carcinoma in situ, grade 3, with close but negative margins.   (3) no need for adjuvant radiation per discussion at multidisciplinary conference 02/12/2016  4) tamoxifen started 01/01/2016   PLAN: Keven is now nearly 3 years out from definitive surgery for breast cancer with no evidence of disease recurrence.  This is very favorable.  She is tolerating tamoxifen well and the plan will be to continue that a total of 5 years.  I think some of them moodiness, not depression, which she is experiencing is due to menopause.  It does not help that her mother in her late 90s just had a stroke.  Of course we are all somewhat stressed because this pandemic situation.  I think she would benefit from low-dose venlafaxine.  This would be helpful in terms of hot flashes as well.  I have written for 37.5 mg and if after 3 weeks or so she does not obtain significant relief she will double the dose to 75 after giving [place] a call  We also discussed Estring if vaginal dryness becomes a problem.  At this point she did not express an interest to start vaginal estrogens.  She will see me again in 1 year.  She knows to call for any other issues that may develop before then.   Tammy Gray, Virgie Dad, MD  09/27/18 4:55 PM Medical Oncology and Hematology [facility] [address] [place], [place] [address] Tel. [phone]  Fax. [phone]    I, Wilburn Mylar, am acting as scribe for Dr. Virgie Dad. Tammy Gray.  I, Lurline Del MD, have reviewed the  above documentation for accuracy and completeness, and I agree with the above.

## 2019-01-10 ENCOUNTER — Other Ambulatory Visit: Payer: Self-pay | Admitting: General Surgery

## 2019-01-10 DIAGNOSIS — Z1231 Encounter for screening mammogram for malignant neoplasm of breast: Secondary | ICD-10-CM

## 2019-01-13 ENCOUNTER — Telehealth: Payer: Self-pay | Admitting: Oncology

## 2019-01-13 NOTE — Telephone Encounter (Signed)
Confirmed June 2021 lab/fu with patient.

## 2019-02-27 ENCOUNTER — Other Ambulatory Visit: Payer: Self-pay

## 2019-02-27 ENCOUNTER — Ambulatory Visit
Admission: RE | Admit: 2019-02-27 | Discharge: 2019-02-27 | Disposition: A | Discharge status: Home / Self Care | Payer: 59 | Source: Ambulatory Visit | Attending: General Surgery | Admitting: General Surgery

## 2019-02-27 DIAGNOSIS — Z1231 Encounter for screening mammogram for malignant neoplasm of breast: Secondary | ICD-10-CM

## 2019-07-22 ENCOUNTER — Ambulatory Visit: Payer: 59 | Attending: Internal Medicine

## 2019-07-22 DIAGNOSIS — Z23 Encounter for immunization: Secondary | ICD-10-CM

## 2019-07-22 NOTE — Progress Notes (Signed)
   Covid-19 Vaccination Clinic  Name:  MELANEE KVALE    MRN: [id number] DOB: November 25, 1965  07/22/2019  Ms. Culton was observed post Covid-19 immunization for 15 minutes without incident. She was provided with Vaccine Information Sheet and instruction to access the V-Safe system.   Ms. [place] was instructed to call 911 with any severe reactions post vaccine: Marland Kitchen Difficulty breathing  . Swelling of face and throat  . A fast heartbeat  . A bad rash all over body  . Dizziness and weakness   Immunizations Administered    Name Date Dose VIS Date Route   Moderna COVID-19 Vaccine 07/22/2019 11:33 AM 0.5 mL 03/21/2019 Intramuscular   Manufacturer: Moderna   Lot: [id number]   [id number]

## 2019-08-19 ENCOUNTER — Ambulatory Visit: Payer: 59 | Attending: Internal Medicine

## 2019-08-19 DIAGNOSIS — Z23 Encounter for immunization: Secondary | ICD-10-CM

## 2019-08-19 NOTE — Progress Notes (Signed)
   Covid-19 Vaccination Clinic  Name:  Tammy Gray    MRN: [id number] DOB: 25-Apr-1965  08/19/2019  [name] was observed post Covid-19 immunization for 15 minutes without incident. She was provided with Vaccine Information Sheet and instruction to access the V-Safe system.   [name] was instructed to call 911 with any severe reactions post vaccine: Marland Kitchen Difficulty breathing  . Swelling of face and throat  . A fast heartbeat  . A bad rash all over body  . Dizziness and weakness   Immunizations Administered    Name Date Dose VIS Date Route   Moderna COVID-19 Vaccine 08/19/2019 10:18 AM 0.5 mL 03/2019 Intramuscular   Manufacturer: Moderna   LotFP:[id number]   [id number]

## 2019-09-25 ENCOUNTER — Other Ambulatory Visit: Payer: Self-pay | Admitting: *Deleted

## 2019-09-25 ENCOUNTER — Other Ambulatory Visit: Payer: Self-pay | Admitting: Oncology

## 2019-09-25 DIAGNOSIS — Z170 Estrogen receptor positive status [ER+]: Secondary | ICD-10-CM

## 2019-09-25 DIAGNOSIS — C50412 Malignant neoplasm of upper-outer quadrant of left female breast: Secondary | ICD-10-CM

## 2019-09-25 NOTE — Progress Notes (Signed)
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   She continues on tamoxifen.  She tolerates this well, with occasional and random hot flashes.  They do not get in the way of her work and do not wake her up particularly so she really does not want to try venlafaxine--we did prescribe it last time but she never started it.  Since her last visit, she underwent right screening mammography with tomography at [facility] on 02/27/2019 showing: breast density category C; no evidence of malignancy.   She received both doses of the Covid-19 vaccine, on 4/3 and 08/19/2019.  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  Last year in April her mother had a stroke and survived until October 2020.  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  Overall though she feels that things are going well right now.   BREAST CANCER HISTORY: From the original intake note:  Tammy Gray had routine screening mammography at [facility] 12/05/2015. There were calcifications in the left breast which were felt to warrant further evaluation. Accordingly on 12/16/2015 she underwent left diagnostic  mammography at the [facility]. This showed the breast density to be category C. In the lateral aspect of the left breast there was an area of 3 mm of course calcifications and 4 cm lateral to this there is an area of developing punctate calcifications measuring 4.7 cm.  Biopsy of these 2 areas was performed 12/20/2015. Both showed high-grade ductal carcinoma in situ, both were estrogen receptor positive (80-90%) and progesterone receptor positive (5-90%), all these being with moderate staining intensity.  Her subsequent history is as detailed below   PAST MEDICAL HISTORY: Past Medical History:  Diagnosis Date  . Breast cancer of upper-outer quadrant of left female breast ([place]) 12/25/2015  . Genetic testing 09/28/2016   [name] underwent genetic counseling and testing for hereditary cancer syndromes on 09/10/2016. Her results were negative for mutations in all 46 genes analyzed by [facility]'s 46-gene Common Hereditary Cancers Panel. Genes analyzed include: APC, ATM, AXIN2, BARD1, BMPR1A, BRCA1, BRCA2, BRIP1, CDH1, CDKN2A, CHEK2, CTNNA1, DICER1, EPCAM, GREM1, HOXB13, KIT, MEN1, MLH1, MSH2, MSH3, MSH6, MUTYH, NBN,   . Herpes 1987  . Hypertension   . IBS (irritable bowel syndrome)     PAST SURGICAL HISTORY: Past Surgical History:  Procedure Laterality Date  . BREAST IMPLANT EXCHANGE Left 07/24/2016   Procedure: REVISION OF LEFT BREAST RECONSTRUCTION WITH LEFT SALINE IMPLANT EXCHANGE AND ALODERM TO LEFT CHEST;  Surgeon: Irene Limbo, MD;  Location: [place];  Service: Plastics;  Laterality: Left;  . BREAST RECONSTRUCTION WITH PLACEMENT OF TISSUE EXPANDER AND FLEX HD (ACELLULAR HYDRATED DERMIS) Left 01/28/2016   Procedure: LEFT BREAST RECONSTRUCTION WITH PLACEMENT OF TISSUE  EXPANDER AND CORTIVA;  Surgeon: Irene Limbo, MD;  Location: [place];  Service: Plastics;  Laterality: Left;  . CESAREAN SECTION    . COLONOSCOPY N/A 02/11/2017   Procedure: SCREENING  COLONOSCOPY;  Surgeon: Leighton Ruff, MD;  Location: [facility];  Service: Endoscopy;  Laterality: N/A;  . LIPOSUCTION WITH LIPOFILLING Left 04/10/2016   Procedure: LIPOFILLING from abdomen to left chest;  Surgeon: Irene Limbo, MD;  Location: [place];  Service: Plastics;  Laterality: Left;  . LIPOSUCTION WITH LIPOFILLING Left 07/24/2016   Procedure: LIPOFILLING FROM ABDOMEN TO LEFT CHEST;  Surgeon: Irene Limbo, MD;  Location: [place];  Service: Plastics;  Laterality: Left;  Marland Kitchen MASTECTOMY Left 2017  . MASTECTOMY W/ SENTINEL NODE BIOPSY Left 01/28/2016   Procedure: LEFT MASTECTOMY WITH SENTINEL LYMPH NODE BIOPSY;  Surgeon: Stark Klein, MD;  Location: [place];  Service: General;  Laterality: Left;  Marland Kitchen MASTOPEXY Right 04/10/2016   Procedure: MASTOPEXY;  Surgeon: Irene Limbo, MD;  Location: [place];  Service: Plastics;  Laterality: Right;  . REDUCTION MAMMAPLASTY Right 2017  . REMOVAL OF TISSUE EXPANDER AND PLACEMENT OF IMPLANT Left 04/10/2016   Procedure: REMOVAL OF TISSUE EXPANDER AND PLACEMENT OF IMPLANT;  Surgeon: Irene Limbo, MD;  Location: [place];  Service: Plastics;  Laterality: Left;    FAMILY HISTORY Family History  Problem Relation Age of Onset  . Other Mother 41       hx of hysterectomy to treat endometriosis and heavy bleeding  . Prostate cancer Father 97       unspecified Gleason score; +prostatectomy  . Colon cancer Paternal Grandmother        dx late 63s; w/ mets, d. 15  . Colon cancer Paternal Grandfather        dx. late 59s  . Stroke Maternal Grandmother   . Diverticulitis Maternal Grandmother   . Hodgkin's lymphoma Cousin        maternal 1st cousin dx. in his 20s-30s  . Breast cancer Cousin        paternal 1st cousin, once-removed (on PGF's side of family) dx late 51s  The patient's father is 35 as of September 2017. He has a history of prostate cancer diagnosed  age 66. The patient's mother is also alive, also 74 as of 09/2018. On the paternal side a grandfather had colon cancer and a grandmother also colon cancer. There is no history of breast or ovarian cancer in the family to the patient's knowledge.   GYNECOLOGIC HISTORY:  No LMP recorded. Patient is perimenopausal. She was on oral contraceptives until the time of this diagnosis, when this was stopped. She has not had a period since January 2019.   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 She is divorced.  Her son [name], age 65 has moved out and works as Radio broadcast assistant at [employer] since graduating from [place] in business in May 2019; son Thomasena Edis, age 56, is working at a [employer].   ADVANCED DIRECTIVES: Not in place. At the 01/01/2016 visit the patient was given the appropriate documents to complete and notarize at her discretion   HEALTH MAINTENANCE: Social History   Tobacco Use  . Smoking status: Never Smoker  . Smokeless tobacco: Never Used  Substance Use Topics  . Alcohol use: No  . Drug use: No     Colonoscopy: 01/2017, normal  PAP: Up-to-date  Bone density: Never   No Known Allergies  Current Outpatient Medications  Medication Sig Dispense Refill  . acetaminophen (TYLENOL) 500 MG tablet Take 500 mg by mouth every 6 (six) hours as needed (for pain.).    Marland Kitchen hydrochlorothiazide (HYDRODIURIL) 12.5 MG tablet Take 12.5 mg by mouth daily.    Marland Kitchen ibuprofen (ADVIL,MOTRIN) 200 MG tablet Take 400 mg by mouth every 8 (eight) hours as needed (for pain.).    Marland Kitchen lisinopril (PRINIVIL,ZESTRIL) 5 MG tablet Take 5 mg by mouth daily.    Marland Kitchen loratadine (CLARITIN) 10 MG tablet Take 10 mg by mouth daily.    . Multiple Vitamin (MULTIVITAMIN WITH MINERALS) TABS tablet Take 1 tablet by mouth at  bedtime.    . tamoxifen (NOLVADEX) 20 MG tablet Take 1 tablet (20 mg total) by mouth daily. 90 tablet 3  . valACYclovir (VALTREX) 500 MG tablet Take 500 mg by mouth at bedtime.    Marland Kitchen venlafaxine XR (EFFEXOR-XR) 37.5 MG 24 hr capsule Take 1 capsule (37.5 mg total) by mouth daily with breakfast. 90 capsule 4   No current facility-administered medications for this visit.    OBJECTIVE: White woman who appears younger than stated age  83:   09/26/19 1243  BP: 128/66  Pulse: 66  Resp: 16  Temp: 98.2 F (36.8 C)  SpO2: 100%     Body mass index is 27.9 kg/m.    ECOG FS:1 - Symptomatic but completely ambulatory  Sclerae unicteric, EOMs intact Wearing a mask No cervical or supraclavicular adenopathy Lungs no rales or rhonchi Heart regular rate and rhythm Abd soft, nontender, positive bowel sounds MSK no focal spinal tenderness, no upper extremity lymphedema Neuro: nonfocal, well oriented, appropriate affect Breasts: The right breast is status post reduction mammoplasty.  It is otherwise unremarkable.  The left breast is status post mastectomy with reconstruction.  There is no evidence of local recurrence.  Both axillae are benign.   LAB RESULTS:  CMP     Component Value Date/Time   NA 144 09/27/2018 1517   NA 139 01/01/2016 1213   K 3.6 09/27/2018 1517   K 3.6 01/01/2016 1213   CL 105 09/27/2018 1517   CO2 27 09/27/2018 1517   CO2 25 01/01/2016 1213   GLUCOSE 106 (H) 09/27/2018 1517   GLUCOSE 120 01/01/2016 1213   BUN 17 09/27/2018 1517   BUN 18.1 01/01/2016 1213   CREATININE 0.82 09/27/2018 1517   CREATININE 0.8 01/01/2016 1213   CALCIUM 9.5 09/27/2018 1517   CALCIUM 9.5 01/01/2016 1213   PROT 7.2 09/27/2018 1517   PROT 7.6 01/01/2016 1213   ALBUMIN 4.2 09/27/2018 1517   ALBUMIN 4.0 01/01/2016 1213   AST 20 09/27/2018 1517   AST 19 01/01/2016 1213   ALT 30 09/27/2018 1517   ALT 32 01/01/2016 1213   ALKPHOS 30 (L) 09/27/2018 1517   ALKPHOS 22 (L) 01/01/2016 1213     BILITOT <0.2 (L) 09/27/2018 1517   BILITOT 0.39 01/01/2016 1213   GFRNONAA >60 09/27/2018 1517   GFRAA >60 09/27/2018 1517    INo results found for: SPEP, UPEP  Lab Results  Component Value Date   WBC 5.9 09/26/2019   NEUTROABS 3.3 09/26/2019   HGB 14.3 09/26/2019   HCT 43.2 09/26/2019   MCV 88.3 09/26/2019   PLT 209 09/26/2019      Chemistry  Component Value Date/Time   NA 144 09/27/2018 1517   NA 139 01/01/2016 1213   K 3.6 09/27/2018 1517   K 3.6 01/01/2016 1213   CL 105 09/27/2018 1517   CO2 27 09/27/2018 1517   CO2 25 01/01/2016 1213   BUN 17 09/27/2018 1517   BUN 18.1 01/01/2016 1213   CREATININE 0.82 09/27/2018 1517   CREATININE 0.8 01/01/2016 1213      Component Value Date/Time   CALCIUM 9.5 09/27/2018 1517   CALCIUM 9.5 01/01/2016 1213   ALKPHOS 30 (L) 09/27/2018 1517   ALKPHOS 22 (L) 01/01/2016 1213   AST 20 09/27/2018 1517   AST 19 01/01/2016 1213   ALT 30 09/27/2018 1517   ALT 32 01/01/2016 1213   BILITOT <0.2 (L) 09/27/2018 1517   BILITOT 0.39 01/01/2016 1213       No results found for: LABCA2  No components found for: LABCA125  No results for input(s): INR in the last 168 hours.  Urinalysis No results found for: COLORURINE, APPEARANCEUR, LABSPEC, PHURINE, GLUCOSEU, HGBUR, BILIRUBINUR, KETONESUR, PROTEINUR, UROBILINOGEN, NITRITE, LEUKOCYTESUR   STUDIES: No results found.   ELIGIBLE FOR AVAILABLE RESEARCH PROTOCOL: no  ASSESSMENT: 54 y.o. [place] woman, status post left breast upper outer quadrant biopsy 12/20/2015 for ductal carcinoma in situ, grade 3, estrogen and progesterone receptor positive.  (1) genetics testing 01/07/2016 through the [place] Panel through [facility] including testing for ATM, BARD1, BRCA1, BRCA2, BRIP1, CDH1, CHEK2, FANCC, MLH1, MSH2, MSH6, NBN, PALB2, PMS2, PTEN, RAD51C, RAD51D, TP53, and XRCC2 was canceled by the patient  (2) status post left mastectomy and  sentinel lymph node sampling 01/28/2016 for a pTis pN0, stage 0 ductal carcinoma in situ, grade 3, with close but negative margins.   (3) no need for adjuvant radiation per discussion at multidisciplinary conference 02/12/2016  4) tamoxifen started 01/01/2016   PLAN: Darina is now close to 4 years out from definitive surgery for her breast cancer with no evidence of disease recurrence.  This is very favorable.  She will complete 5 years of tamoxifen next August and I will see her at that time for her "graduation visit".  We again discussed venlafaxine and she again opts against it.  She is doing fine with the hot flashes on her own she says  Total encounter time 20 minutes.*  Garhett Bernhard, Virgie Dad, MD  09/26/19 12:59 PM Medical Oncology and Hematology [facility] [place], [place] [address] Tel. [phone]    Fax. [phone]    I, Wilburn Mylar, am acting as scribe for Dr. Virgie Dad. Seleta Hovland.  I, Lurline Del MD, have reviewed the above documentation for accuracy and completeness, and I agree with the above.   *Total Encounter Time as defined by the Centers for Medicare and Medicaid Services includes, in addition to the face-to-face time of a patient visit (documented in the note above) non-face-to-face time: obtaining and reviewing outside history, ordering and reviewing medications, tests or procedures, care coordination (communications with other health care professionals or caregivers) and documentation in the medical record.

## 2019-09-26 ENCOUNTER — Inpatient Hospital Stay: Payer: 59

## 2019-09-26 ENCOUNTER — Inpatient Hospital Stay: Payer: 59 | Attending: Oncology | Admitting: Oncology

## 2019-09-26 ENCOUNTER — Telehealth: Payer: Self-pay | Admitting: Oncology

## 2019-09-26 ENCOUNTER — Other Ambulatory Visit: Payer: Self-pay

## 2019-09-26 VITALS — BP 128/66 | HR 66 | Temp 98.2°F | Resp 16 | Ht 63.5 in | Wt 160.0 lb

## 2019-09-26 DIAGNOSIS — Z79810 Long term (current) use of selective estrogen receptor modulators (SERMs): Secondary | ICD-10-CM | POA: Insufficient documentation

## 2019-09-26 DIAGNOSIS — Z791 Long term (current) use of non-steroidal anti-inflammatories (NSAID): Secondary | ICD-10-CM | POA: Insufficient documentation

## 2019-09-26 DIAGNOSIS — R232 Flushing: Secondary | ICD-10-CM | POA: Insufficient documentation

## 2019-09-26 DIAGNOSIS — Z170 Estrogen receptor positive status [ER+]: Secondary | ICD-10-CM | POA: Diagnosis not present

## 2019-09-26 DIAGNOSIS — Z79899 Other long term (current) drug therapy: Secondary | ICD-10-CM | POA: Insufficient documentation

## 2019-09-26 DIAGNOSIS — K589 Irritable bowel syndrome without diarrhea: Secondary | ICD-10-CM | POA: Insufficient documentation

## 2019-09-26 DIAGNOSIS — D0512 Intraductal carcinoma in situ of left breast: Secondary | ICD-10-CM | POA: Insufficient documentation

## 2019-09-26 DIAGNOSIS — I10 Essential (primary) hypertension: Secondary | ICD-10-CM | POA: Diagnosis not present

## 2019-09-26 DIAGNOSIS — C50412 Malignant neoplasm of upper-outer quadrant of left female breast: Secondary | ICD-10-CM

## 2019-09-26 LAB — CMP (CANCER CENTER ONLY)
ALT: 37 U/L (ref 0–44)
AST: 23 U/L (ref 15–41)
Albumin: 4.2 g/dL (ref 3.5–5.0)
Alkaline Phosphatase: 22 U/L — ABNORMAL LOW (ref 38–126)
Anion gap: 12 (ref 5–15)
BUN: 16 mg/dL (ref 6–20)
CO2: 26 mmol/L (ref 22–32)
Calcium: 9.5 mg/dL (ref 8.9–10.3)
Chloride: 102 mmol/L (ref 98–111)
Creatinine: 0.76 mg/dL (ref 0.44–1.00)
GFR, Est AFR Am: >60 mL/min (ref >60)
GFR, Estimated: >60 mL/min (ref >60)
Glucose, Bld: 91 mg/dL (ref 70–99)
Potassium: 3.9 mmol/L (ref 3.5–5.1)
Sodium: 140 mmol/L (ref 135–145)
Total Bilirubin: 0.3 mg/dL (ref 0.3–1.2)
Total Protein: 7.1 g/dL (ref 6.5–8.1)

## 2019-09-26 LAB — CBC WITH DIFFERENTIAL (CANCER CENTER ONLY)
Abs Immature Granulocytes: 0.01 10*3/uL (ref 0.00–0.07)
Basophils Absolute: 0 10*3/uL (ref 0.0–0.1)
Basophils Relative: 1 %
Eosinophils Absolute: 0.1 10*3/uL (ref 0.0–0.5)
Eosinophils Relative: 1 %
HCT: 43.2 % (ref 36.0–46.0)
Hemoglobin: 14.3 g/dL (ref 12.0–15.0)
Immature Granulocytes: 0 %
Lymphocytes Relative: 35 %
Lymphs Abs: 2.1 10*3/uL (ref 0.7–4.0)
MCH: 29.2 pg (ref 26.0–34.0)
MCHC: 33.1 g/dL (ref 30.0–36.0)
MCV: 88.3 fL (ref 80.0–100.0)
Monocytes Absolute: 0.4 10*3/uL (ref 0.1–1.0)
Monocytes Relative: 7 %
Neutro Abs: 3.3 10*3/uL (ref 1.7–7.7)
Neutrophils Relative %: 56 %
Platelet Count: 209 10*3/uL (ref 150–400)
RBC: 4.89 MIL/uL (ref 3.87–5.11)
RDW: 13.2 % (ref 11.5–15.5)
WBC Count: 5.9 10*3/uL (ref 4.0–10.5)
nRBC: 0 % (ref 0.0–0.2)

## 2019-09-26 MED ORDER — TAMOXIFEN CITRATE 20 MG PO TABS: 20.0000 mg | ORAL_TABLET | Freq: Every day | ORAL | 3 refills | Status: DC

## 2019-09-26 NOTE — Telephone Encounter (Signed)
Scheduled appts per 6/8 los. Pt declined print out of AVS and stated she would refer to [place].

## 2019-10-09 ENCOUNTER — Other Ambulatory Visit: Payer: Self-pay | Admitting: *Deleted

## 2019-10-09 MED ORDER — TAMOXIFEN CITRATE 20 MG PO TABS: 20.0000 mg | ORAL_TABLET | Freq: Every day | ORAL | 3 refills | Status: DC

## 2019-10-11 ENCOUNTER — Other Ambulatory Visit: Payer: Self-pay | Admitting: *Deleted

## 2019-10-11 MED ORDER — TAMOXIFEN CITRATE 20 MG PO TABS: 20.0000 mg | ORAL_TABLET | Freq: Every day | ORAL | 3 refills | Status: DC

## 2020-01-29 ENCOUNTER — Other Ambulatory Visit: Payer: Self-pay | Admitting: Oncology

## 2020-01-29 DIAGNOSIS — Z1231 Encounter for screening mammogram for malignant neoplasm of breast: Secondary | ICD-10-CM

## 2020-02-29 ENCOUNTER — Other Ambulatory Visit: Payer: Self-pay

## 2020-02-29 ENCOUNTER — Ambulatory Visit
Admission: RE | Admit: 2020-02-29 | Discharge: 2020-02-29 | Disposition: A | Discharge status: Home / Self Care | Payer: 59 | Source: Ambulatory Visit | Attending: Oncology | Admitting: Oncology

## 2020-02-29 DIAGNOSIS — Z1231 Encounter for screening mammogram for malignant neoplasm of breast: Secondary | ICD-10-CM

## 2020-06-17 ENCOUNTER — Telehealth: Payer: Self-pay | Admitting: Oncology

## 2020-06-17 NOTE — Telephone Encounter (Signed)
R/s appt om 8./11/2020 per provider request - called pt , left message for patient with appt date and time

## 2020-11-03 ENCOUNTER — Other Ambulatory Visit: Payer: Self-pay | Admitting: Oncology

## 2020-11-04 NOTE — Telephone Encounter (Signed)
Reviewed appts, and patient started Tamoxifen in 10/2015, will not refill at this point.

## 2020-11-24 NOTE — Progress Notes (Signed)
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   She ran out of tamoxifen November 01, 2020 and decided not to refill it since she was coming to see me today and she knew she was completing 5 years.  In general she tolerated tamoxifen well and now that she is off it she is having more issues with vaginal dryness.  Since her last visit, she underwent right screening mammography with tomography at [facility] on 02/29/2020 showing: breast density category C; no evidence of malignancy.     REVIEW OF SYSTEMS: Tammy Tammy Gray continues to work full-time and to enjoy her work.  She is very pleased with the way her children are doing.  She exercises 6 or 7 days a week doing at least 45 minutes of aerobics 3 times a week, doing the treadmill all the days and doing some resistance at the gym.  A detailed review of systems was otherwise stable   COVID 19 VACCINATION STATUS: Moderna x2, no booster as of August 2020   BREAST CANCER HISTORY: From the original intake note:  Tammy Tammy Gray had routine screening mammography at [facility] 12/05/2015. There were calcifications in the left breast which were felt to warrant further evaluation. Accordingly on 12/16/2015 she underwent left diagnostic mammography at the [facility]. This showed the breast density to be category C. In the  lateral aspect of the left breast there was an area of 3 mm of course calcifications and 4 cm lateral to this there is an area of developing punctate calcifications measuring 4.7 cm.  Biopsy of these 2 areas was performed 12/20/2015. Both showed high-grade ductal carcinoma in situ, both were estrogen receptor positive (80-90%) and progesterone receptor positive (5-90%), all these being with moderate staining intensity.  Her subsequent history is as detailed below   PAST MEDICAL HISTORY: Past Medical History:  Diagnosis Date   Breast cancer of upper-outer quadrant of left female breast ([facility]) 12/25/2015   Genetic testing 09/28/2016   Tammy Tammy Gray underwent genetic counseling and testing for hereditary cancer syndromes on 09/10/2016. Her results were negative for mutations in all 46 genes analyzed by [facility]'s 46-gene Common Hereditary Cancers Panel. Genes analyzed include: APC, ATM, AXIN2, BARD1, BMPR1A, BRCA1, BRCA2, BRIP1, CDH1, CDKN2A, CHEK2, CTNNA1, DICER1, EPCAM, GREM1, HOXB13, KIT, MEN1, MLH1, MSH2, MSH3, MSH6, MUTYH, NBN,    Herpes 1987   Hypertension    IBS (irritable bowel syndrome)     PAST SURGICAL HISTORY: Past Surgical History:  Procedure Laterality Date   BREAST IMPLANT EXCHANGE Left 07/24/2016   Procedure: REVISION OF LEFT BREAST RECONSTRUCTION WITH LEFT SALINE IMPLANT EXCHANGE AND ALODERM TO LEFT CHEST;  Surgeon: Tammy Limbo, MD;  [facility]: [facility];  Service: Plastics;  Laterality: Left;   BREAST RECONSTRUCTION WITH PLACEMENT OF TISSUE EXPANDER AND FLEX HD (ACELLULAR HYDRATED DERMIS) Left 01/28/2016   Procedure: LEFT BREAST RECONSTRUCTION WITH PLACEMENT OF TISSUE EXPANDER AND CORTIVA;  Surgeon: Tammy Limbo, MD;  [facility];  Service: Plastics;  Laterality: Left;   CESAREAN SECTION     COLONOSCOPY N/A 02/11/2017   Procedure: SCREENING COLONOSCOPY;  Surgeon: Tammy Ruff, MD;  Location: [facility];  Service: Endoscopy;   Laterality: N/A;   LIPOSUCTION WITH LIPOFILLING Left 04/10/2016   Procedure: LIPOFILLING from abdomen to left chest;  Surgeon: Tammy Limbo, MD;  [facility]: [facility];  Service: Plastics;  Laterality: Left;   LIPOSUCTION WITH LIPOFILLING Left 07/24/2016   Procedure: LIPOFILLING FROM ABDOMEN TO LEFT CHEST;  Surgeon: Tammy Limbo, MD;  [facility]: [facility];  Service: Plastics;  Laterality: Left;   MASTECTOMY Left 2017   MASTECTOMY W/ SENTINEL NODE BIOPSY Left 01/28/2016   Procedure: LEFT MASTECTOMY WITH SENTINEL LYMPH NODE BIOPSY;  Surgeon: Tammy Klein, MD;  [facility]: [facility];  Service: General;  Laterality: Left;   MASTOPEXY Right 04/10/2016   Procedure: MASTOPEXY;  Surgeon: Tammy Limbo, MD;  [facility]: [facility];  Service: Plastics;  Laterality: Right;   REDUCTION MAMMAPLASTY Right 2017   REMOVAL OF TISSUE EXPANDER AND PLACEMENT OF IMPLANT Left 04/10/2016   Procedure: REMOVAL OF TISSUE EXPANDER AND PLACEMENT OF IMPLANT;  Surgeon: Tammy Limbo, MD;  Location: [name];  Service: Plastics;  Laterality: Left;    FAMILY HISTORY Family History  Problem Relation Age of Onset   Other Mother 37       hx of hysterectomy to treat endometriosis and heavy bleeding   Prostate cancer Father 72       unspecified Gleason score; +prostatectomy   Colon cancer Paternal Grandmother        dx late 11s; w/ mets, d. 56   Colon cancer Paternal Grandfather        dx. late 44s   Stroke Maternal Grandmother    Diverticulitis Maternal Grandmother    Hodgkin's lymphoma Cousin        maternal 1st cousin dx. in his 30s-30s   Breast cancer Cousin        paternal 1st cousin, once-removed (on PGF's side of family) dx late 4s  The patient's father is 97 as of September 2017. He has a history of prostate cancer diagnosed age 90. The patient's mother died in 2019-02-11 following a stroke. On the paternal side a grandfather had  colon cancer and a grandmother also colon cancer. There is no history of breast or ovarian cancer in the family to the patient's knowledge.   GYNECOLOGIC HISTORY:  No LMP recorded. Patient is perimenopausal. She was on oral contraceptives until the time of this diagnosis, when this was stopped. She has not had a period since January 2019.   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 She is divorced.  Her son Tammy Tammy Gray, graduated from [employer] in business in May 2019 and is currently working on Chief Executive Officer, living in [place];; son Tammy Tammy Gray, still living at home, is working in an air conditioning and heating business.  ADVANCED DIRECTIVES: Not in place. At the 11/25/2020 visit the patient was given the appropriate documents to complete and notarized at her discretion.  HEALTH MAINTENANCE: Social History   Tobacco Use   Smoking status: Never   Smokeless tobacco: Never  Substance Use Topics   Alcohol use: No   Drug use: No     Colonoscopy:  01/2017, normal  PAP: Up-to-date  Bone density: Never   No Known Allergies  Current Outpatient Medications  Medication Sig Dispense Refill   acetaminophen (TYLENOL) 500 MG tablet Take 500 mg by mouth every 6 (six) hours as needed (for pain.).     hydrochlorothiazide (HYDRODIURIL) 12.5 MG tablet Take 12.5 mg by mouth daily.     ibuprofen (ADVIL,MOTRIN) 200 MG tablet Take 400 mg by mouth every 8 (eight) hours as needed (for pain.).     lisinopril (PRINIVIL,ZESTRIL) 5 MG tablet Take 5 mg by mouth daily.     loratadine (CLARITIN) 10 MG tablet Take 10 mg by mouth daily.     Multiple Vitamin (MULTIVITAMIN WITH MINERALS) TABS tablet Take 1 tablet by mouth at bedtime.     tamoxifen (NOLVADEX) 20 MG tablet Take 1 tablet (20 mg total) by mouth daily. 90 tablet 3    valACYclovir (VALTREX) 500 MG tablet Take 500 mg by mouth at bedtime.     No current facility-administered medications for this visit.    OBJECTIVE: White woman who appears younger than stated age  43:   11/25/20 1327  BP: 122/64  Pulse: 64  Resp: 18  Temp: 98.1 F (36.7 C)  SpO2: 98%      Body mass index is 28.09 kg/m.    ECOG FS:1 - Symptomatic but completely ambulatory  Sclerae unicteric, EOMs intact Wearing a mask No cervical or supraclavicular adenopathy Lungs no rales or rhonchi Heart regular rate and rhythm Abd soft, nontender, positive bowel sounds MSK no focal spinal tenderness, no upper extremity lymphedema Neuro: nonfocal, well oriented, appropriate affect Breasts: The right breast is status post reduction mammoplasty.  There are no suspicious findings.  The left breast is status postmastectomy with reconstruction, with no evidence of local recurrence.  Both axillae are benign   LAB RESULTS:  CMP     Component Value Date/Time   NA 140 09/26/2019 1232   NA 139 01/01/2016 1213   K 3.9 09/26/2019 1232   K 3.6 01/01/2016 1213   CL 102 09/26/2019 1232   CO2 26 09/26/2019 1232   CO2 25 01/01/2016 1213   GLUCOSE 91 09/26/2019 1232   GLUCOSE 120 01/01/2016 1213   BUN 16 09/26/2019 1232   BUN 18.1 01/01/2016 1213   CREATININE 0.76 09/26/2019 1232   CREATININE 0.8 01/01/2016 1213   CALCIUM 9.5 09/26/2019 1232   CALCIUM 9.5 01/01/2016 1213   PROT 7.1 09/26/2019 1232   PROT 7.6 01/01/2016 1213   ALBUMIN 4.2 09/26/2019 1232   ALBUMIN 4.0 01/01/2016 1213   AST 23 09/26/2019 1232   AST 19 01/01/2016 1213   ALT 37 09/26/2019 1232   ALT 32 01/01/2016 1213   ALKPHOS 22 (L) 09/26/2019 1232   ALKPHOS 22 (L) 01/01/2016 1213   BILITOT 0.3 09/26/2019 1232   BILITOT 0.39 01/01/2016 1213   GFRNONAA >60 09/26/2019 1232   GFRAA >60 09/26/2019 1232    INo results found for: SPEP, UPEP  Lab Results  Component Value Date   WBC 4.9 11/25/2020   NEUTROABS 2.3  11/25/2020   HGB 13.4 11/25/2020   HCT 39.1 11/25/2020   MCV 84.6 11/25/2020   PLT 183 11/25/2020      Chemistry      Component Value Date/Time   NA 140 09/26/2019 1232   NA 139 01/01/2016 1213   K 3.9 09/26/2019 1232   K 3.6 01/01/2016 1213   CL 102 09/26/2019 1232   CO2 26 09/26/2019 1232   CO2  25 01/01/2016 1213   BUN 16 09/26/2019 1232   BUN 18.1 01/01/2016 1213   CREATININE 0.76 09/26/2019 1232   CREATININE 0.8 01/01/2016 1213      Component Value Date/Time   CALCIUM 9.5 09/26/2019 1232   CALCIUM 9.5 01/01/2016 1213   ALKPHOS 22 (L) 09/26/2019 1232   ALKPHOS 22 (L) 01/01/2016 1213   AST 23 09/26/2019 1232   AST 19 01/01/2016 1213   ALT 37 09/26/2019 1232   ALT 32 01/01/2016 1213   BILITOT 0.3 09/26/2019 1232   BILITOT 0.39 01/01/2016 1213       No results found for: LABCA2  No components found for: LABCA125  No results for input(s): INR in the last 168 hours.  Urinalysis No results found for: COLORURINE, APPEARANCEUR, LABSPEC, PHURINE, GLUCOSEU, HGBUR, BILIRUBINUR, KETONESUR, PROTEINUR, UROBILINOGEN, NITRITE, LEUKOCYTESUR   STUDIES: No results found.   ELIGIBLE FOR AVAILABLE RESEARCH PROTOCOL: no  ASSESSMENT: 55 y.o. Sanborn woman, status post left breast upper outer quadrant biopsy 12/20/2015 for ductal carcinoma in situ, grade 3, estrogen and progesterone receptor positive.  (1) genetics testing 01/07/2016 through the [facility] Panel through [facility] including testing for ATM, BARD1, BRCA1, BRCA2, BRIP1, CDH1, CHEK2, FANCC, MLH1, MSH2, MSH6, NBN, PALB2, PMS2, PTEN, RAD51C, RAD51D, TP53, and XRCC2 was canceled by the patient  (2) status post left mastectomy and sentinel lymph node sampling 01/28/2016 for a pTis pN0, stage 0 ductal carcinoma in situ, grade 3, with close but negative margins.   (3) no need for adjuvant radiation per discussion at multidisciplinary conference 02/12/2016  4) tamoxifen started  01/01/2016, completing five years July 2022   PLAN: Tammy Tammy Gray is now just about 5 years out from definitive surgery for her breast cancer with no evidence of disease recurrence.  This is very favorable.  She has completed 5 years of tamoxifen.  She understands that for noninvasive breast cancer we do not go beyond 5years or at least we have no data for continuing beyond 5 years.  Now that she is off tamoxifen she is having a little bit more vaginal dryness issues.  I gave her information on our pelvic rehab program.  She will also discuss this concern further with Dr. Stann Mainland.  At this point I feel comfortable releasing her to her primary care physicians.  All she will need in terms of breast cancer follow-up is a yearly right mammogram and a yearly physician breast exam  I will be glad to see her again at any point in the future if and when the need arises but as of now are making no further routine appointments for her here  Total encounter time 25 minutes.*  Leiby Pigeon, Tammy Dad, MD  11/25/20 1:48 PM Medical Oncology and Hematology [facility] [place], [place] [address] Tel. [phone]    Fax. [phone]    I, Wilburn Mylar, am acting as scribe for Dr. Virgie Gray. Jerlene Rockers.  I, Lurline Del MD, have reviewed the above documentation for accuracy and completeness, and I agree with the above.   *Total Encounter Time as defined by the Centers for Medicare and Medicaid Services includes, in addition to the face-to-face time of a patient visit (documented in the note above) non-face-to-face time: obtaining and reviewing outside history, ordering and reviewing medications, tests or procedures, care coordination (communications with other health care professionals or caregivers) and documentation in the medical record.

## 2020-11-25 ENCOUNTER — Ambulatory Visit: Payer: 59 | Admitting: Oncology

## 2020-11-25 ENCOUNTER — Inpatient Hospital Stay: Payer: 59 | Attending: Oncology

## 2020-11-25 ENCOUNTER — Other Ambulatory Visit: Payer: Self-pay

## 2020-11-25 ENCOUNTER — Other Ambulatory Visit: Payer: 59

## 2020-11-25 ENCOUNTER — Inpatient Hospital Stay (HOSPITAL_BASED_OUTPATIENT_CLINIC_OR_DEPARTMENT_OTHER): Payer: 59 | Admitting: Oncology

## 2020-11-25 VITALS — BP 122/64 | HR 64 | Temp 98.1°F | Resp 18 | Ht 63.5 in | Wt 161.1 lb

## 2020-11-25 DIAGNOSIS — C50419 Malignant neoplasm of upper-outer quadrant of unspecified female breast: Secondary | ICD-10-CM | POA: Diagnosis not present

## 2020-11-25 DIAGNOSIS — C50412 Malignant neoplasm of upper-outer quadrant of left female breast: Secondary | ICD-10-CM

## 2020-11-25 DIAGNOSIS — Z803 Family history of malignant neoplasm of breast: Secondary | ICD-10-CM | POA: Insufficient documentation

## 2020-11-25 DIAGNOSIS — Z8379 Family history of other diseases of the digestive system: Secondary | ICD-10-CM | POA: Diagnosis not present

## 2020-11-25 DIAGNOSIS — Z170 Estrogen receptor positive status [ER+]: Secondary | ICD-10-CM

## 2020-11-25 LAB — COMPREHENSIVE METABOLIC PANEL
ALT: 29 U/L (ref 0–44)
AST: 20 U/L (ref 15–41)
Albumin: 4 g/dL (ref 3.5–5.0)
Alkaline Phosphatase: 28 U/L — ABNORMAL LOW (ref 38–126)
Anion gap: 10 (ref 5–15)
BUN: 18 mg/dL (ref 6–20)
CO2: 27 mmol/L (ref 22–32)
Calcium: 9.4 mg/dL (ref 8.9–10.3)
Chloride: 103 mmol/L (ref 98–111)
Creatinine, Ser: 0.78 mg/dL (ref 0.44–1.00)
GFR, Estimated: >60 mL/min (ref >60)
Glucose, Bld: 108 mg/dL — ABNORMAL HIGH (ref 70–99)
Potassium: 4 mmol/L (ref 3.5–5.1)
Sodium: 140 mmol/L (ref 135–145)
Total Bilirubin: <0.2 mg/dL — ABNORMAL LOW (ref 0.3–1.2)
Total Protein: 7 g/dL (ref 6.5–8.1)

## 2020-11-25 LAB — CBC WITH DIFFERENTIAL/PLATELET
Abs Immature Granulocytes: 0.01 10*3/uL (ref 0.00–0.07)
Basophils Absolute: 0 10*3/uL (ref 0.0–0.1)
Basophils Relative: 1 %
Eosinophils Absolute: 0.1 10*3/uL (ref 0.0–0.5)
Eosinophils Relative: 2 %
HCT: 39.1 % (ref 36.0–46.0)
Hemoglobin: 13.4 g/dL (ref 12.0–15.0)
Immature Granulocytes: 0 %
Lymphocytes Relative: 41 %
Lymphs Abs: 2 10*3/uL (ref 0.7–4.0)
MCH: 29 pg (ref 26.0–34.0)
MCHC: 34.3 g/dL (ref 30.0–36.0)
MCV: 84.6 fL (ref 80.0–100.0)
Monocytes Absolute: 0.4 10*3/uL (ref 0.1–1.0)
Monocytes Relative: 9 %
Neutro Abs: 2.3 10*3/uL (ref 1.7–7.7)
Neutrophils Relative %: 47 %
Platelets: 183 10*3/uL (ref 150–400)
RBC: 4.62 MIL/uL (ref 3.87–5.11)
RDW: 12.9 % (ref 11.5–15.5)
WBC: 4.9 10*3/uL (ref 4.0–10.5)
nRBC: 0 % (ref 0.0–0.2)

## 2021-02-26 ENCOUNTER — Other Ambulatory Visit: Payer: Self-pay | Admitting: Obstetrics & Gynecology

## 2021-02-26 DIAGNOSIS — Z1231 Encounter for screening mammogram for malignant neoplasm of breast: Secondary | ICD-10-CM

## 2021-03-25 ENCOUNTER — Ambulatory Visit
Admission: RE | Admit: 2021-03-25 | Discharge: 2021-03-25 | Disposition: A | Discharge status: Home / Self Care | Payer: 59 | Source: Ambulatory Visit | Attending: Obstetrics & Gynecology | Admitting: Obstetrics & Gynecology

## 2021-03-25 DIAGNOSIS — Z1231 Encounter for screening mammogram for malignant neoplasm of breast: Secondary | ICD-10-CM

## 2022-01-29 ENCOUNTER — Other Ambulatory Visit: Payer: Self-pay | Admitting: Obstetrics & Gynecology

## 2022-01-29 DIAGNOSIS — Z1231 Encounter for screening mammogram for malignant neoplasm of breast: Secondary | ICD-10-CM

## 2022-03-26 ENCOUNTER — Ambulatory Visit
Admission: RE | Admit: 2022-03-26 | Discharge: 2022-03-26 | Disposition: A | Discharge status: Home / Self Care | Payer: 59 | Source: Ambulatory Visit | Attending: Obstetrics & Gynecology | Admitting: Obstetrics & Gynecology

## 2022-03-26 DIAGNOSIS — Z1231 Encounter for screening mammogram for malignant neoplasm of breast: Secondary | ICD-10-CM

## 2022-09-19 ENCOUNTER — Ambulatory Visit
Admission: RE | Admit: 2022-09-19 | Discharge: 2022-09-19 | Disposition: A | Discharge status: Home / Self Care | Payer: 59 | Source: Ambulatory Visit

## 2022-09-19 ENCOUNTER — Other Ambulatory Visit: Payer: Self-pay

## 2022-09-19 VITALS — BP 135/77 | HR 78 | Temp 98.2°F | Resp 17

## 2022-09-19 DIAGNOSIS — J0100 Acute maxillary sinusitis, unspecified: Secondary | ICD-10-CM | POA: Diagnosis not present

## 2022-09-19 DIAGNOSIS — J309 Allergic rhinitis, unspecified: Secondary | ICD-10-CM | POA: Diagnosis not present

## 2022-09-19 DIAGNOSIS — R0981 Nasal congestion: Secondary | ICD-10-CM | POA: Diagnosis not present

## 2022-09-19 DIAGNOSIS — R059 Cough, unspecified: Secondary | ICD-10-CM | POA: Diagnosis not present

## 2022-09-19 MED ORDER — FEXOFENADINE HCL 180 MG PO TABS: 180.0000 mg | ORAL_TABLET | Freq: Every day | ORAL | 0 refills | Status: AC

## 2022-09-19 MED ORDER — AMOXICILLIN-POT CLAVULANATE 875-125 MG PO TABS: 1.0000 | ORAL_TABLET | Freq: Two times a day (BID) | ORAL | 0 refills | Status: AC

## 2022-09-19 MED ORDER — PREDNISONE 20 MG PO TABS: ORAL_TABLET | ORAL | 0 refills | Status: DC

## 2022-09-19 MED ORDER — BENZONATATE 200 MG PO CAPS: 200.0000 mg | ORAL_CAPSULE | Freq: Three times a day (TID) | ORAL | 0 refills | Status: AC | PRN

## 2022-09-19 NOTE — Discharge Instructions (Addendum)
Instructed patient to discontinue Claritin.  Advised patient to take medication as directed with food to completion.  Advised patient to take prednisone and Allegra with first dose of Augmentin for the next 5 of 10 days.  Advised may use Allegra as needed afterwards for concurrent postnasal drainage/drip.  Advised may use Tessalon daily or as needed for cough.  Encouraged increase daily water intake to 64 ounces per day while taking these medications.

## 2022-09-19 NOTE — ED Provider Notes (Signed)
[facility]    CSN: [id number] Arrival date & time: 09/19/22  1418      History   Chief Complaint Chief Complaint  Patient presents with   Nasal Congestion    Runny nose   Cough   Sore Throat    HPI Tammy Gray is a 57 y.o. female.   HPI  Pleasant 57 year old female presents with nasal congestion cough and sore throat for 5-6 days.   PMH significant for breast cancer of left breast, HTN, and IBS.  Past Medical History:  Diagnosis Date   Breast cancer of upper-outer quadrant of left female breast (HCC) 12/25/2015   Genetic testing 09/28/2016   Ms. Bentler underwent genetic counseling and testing for hereditary cancer syndromes on 09/10/2016. Her results were negative for mutations in all 46 genes analyzed by [facility]'s 46-gene Common Hereditary Cancers Panel. Genes analyzed include: APC, ATM, AXIN2, BARD1, BMPR1A, BRCA1, BRCA2, BRIP1, CDH1, CDKN2A, CHEK2, CTNNA1, DICER1, EPCAM, GREM1, HOXB13, KIT, MEN1, MLH1, MSH2, MSH3, MSH6, MUTYH, NBN,    Herpes 1987   Hypertension    IBS (irritable bowel syndrome)     Patient Active Problem List   Diagnosis Date Noted   Genetic testing 09/28/2016   Ductal carcinoma in situ (DCIS) of left breast 02/12/2016   Malignant neoplasm of upper-outer quadrant of left breast in female, estrogen receptor positive (HCC) 12/25/2015    Past Surgical History:  Procedure Laterality Date   BREAST BIOPSY Left 2017   BREAST IMPLANT EXCHANGE Left 07/24/2016   Procedure: REVISION OF LEFT BREAST RECONSTRUCTION WITH LEFT SALINE IMPLANT EXCHANGE AND ALODERM TO LEFT CHEST;  Surgeon: Glenna Fellows, MD;  Location: [facility];  Service: Plastics;  Laterality: Left;   BREAST RECONSTRUCTION WITH PLACEMENT OF TISSUE EXPANDER AND FLEX HD (ACELLULAR HYDRATED DERMIS) Left 01/28/2016   Procedure: LEFT BREAST RECONSTRUCTION WITH PLACEMENT OF TISSUE EXPANDER AND CORTIVA;  Surgeon: Glenna Fellows, MD;  Location: [facility];   Service: Plastics;  Laterality: Left;   CESAREAN SECTION     COLONOSCOPY N/A 02/11/2017   Procedure: SCREENING COLONOSCOPY;  Surgeon: Romie Levee, MD;  Location: [facility];  Service: Endoscopy;  Laterality: N/A;   LIPOSUCTION WITH LIPOFILLING Left 04/10/2016   Procedure: LIPOFILLING from abdomen to left chest;  Surgeon: Glenna Fellows, MD;  Location: [facility];  Service: Plastics;  Laterality: Left;   LIPOSUCTION WITH LIPOFILLING Left 07/24/2016   Procedure: LIPOFILLING FROM ABDOMEN TO LEFT CHEST;  Surgeon: Glenna Fellows, MD;  Location: [facility];  Service: Plastics;  Laterality: Left;   MASTECTOMY Left 2017   MASTECTOMY W/ SENTINEL NODE BIOPSY Left 01/28/2016   Procedure: LEFT MASTECTOMY WITH SENTINEL LYMPH NODE BIOPSY;  Surgeon: Almond Lint, MD;  Location: [facility];  Service: General;  Laterality: Left;   MASTOPEXY Right 04/10/2016   Procedure: MASTOPEXY;  Surgeon: Glenna Fellows, MD;  Location: [facility];  Service: Plastics;  Laterality: Right;   REDUCTION MAMMAPLASTY Right 2017   REMOVAL OF TISSUE EXPANDER AND PLACEMENT OF IMPLANT Left 04/10/2016   Procedure: REMOVAL OF TISSUE EXPANDER AND PLACEMENT OF IMPLANT;  Surgeon: Glenna Fellows, MD;  Location: [facility];  Service: Plastics;  Laterality: Left;    OB History   No obstetric history on file.      Home Medications    Prior to Admission medications   Medication Sig Start Date End Date Taking? Authorizing Provider  amoxicillin-clavulanate (AUGMENTIN) 875-125 MG tablet Take 1 tablet by mouth  2 (two) times daily for 10 days. 09/19/22 09/29/22 Yes Trevor Iha, FNP  benzonatate (TESSALON) 200 MG capsule Take 1 capsule (200 mg total) by mouth 3 (three) times daily as needed for up to 7 days. 09/19/22 09/26/22 Yes Trevor Iha, FNP  fexofenadine [facility] ALLERGY) 180 MG tablet Take 1 tablet (180 mg total) by mouth daily for 15 days. 09/19/22  10/04/22 Yes Trevor Iha, FNP  predniSONE (DELTASONE) 20 MG tablet Take 3 tabs PO daily x 5 days. 09/19/22  Yes Trevor Iha, FNP  valACYclovir (VALTREX) 500 MG tablet Take by mouth. 01/06/16  Yes [provider]  hydrochlorothiazide (HYDRODIURIL) 12.5 MG tablet Take 12.5 mg by mouth daily.    [provider]  lisinopril (PRINIVIL,ZESTRIL) 5 MG tablet Take 5 mg by mouth daily.    [provider]  Multiple Vitamin (MULTIVITAMIN WITH MINERALS) TABS tablet Take 1 tablet by mouth at bedtime.    [provider]    Family History Family History  Problem Relation Age of Onset   Other Mother 37       hx of hysterectomy to treat endometriosis and heavy bleeding   Prostate cancer Father 23       unspecified Gleason score; +prostatectomy   Colon cancer Paternal Grandmother        dx late 33s; w/ mets, d. 1   Colon cancer Paternal Grandfather        dx. late 59s   Stroke Maternal Grandmother    Diverticulitis Maternal Grandmother    Hodgkin's lymphoma Cousin        maternal 1st cousin dx. in his 78s-30s   Breast cancer Cousin        paternal 1st cousin, once-removed (on PGF's side of family) dx late 54s    Social History Social History   Tobacco Use   Smoking status: Never   Smokeless tobacco: Never  Substance Use Topics   Alcohol use: No   Drug use: No     Allergies   Patient has no known allergies.   Review of Systems Review of Systems  HENT:  Positive for congestion, sinus pressure and sore throat.   Respiratory:  Positive for cough.   All other systems reviewed and are negative.    Physical Exam Triage Vital Signs ED Triage Vitals  Enc Vitals Group     BP      Pulse      Resp      Temp      Temp src      SpO2      Weight      Height      Head Circumference      Peak Flow      Pain Score      Pain Loc      Pain Edu?      Excl. in GC?    No data found.  Updated Vital Signs BP 135/77 (BP Location: Right Arm)   Pulse  78   Temp 98.2 F (36.8 C) (Oral)   Resp 17   SpO2 97%      Physical Exam Vitals and nursing note reviewed.  Constitutional:      Appearance: Normal appearance. She is normal weight.  HENT:     Head: Normocephalic and atraumatic.     Right Ear: Tympanic membrane and external ear normal.     Left Ear: Tympanic membrane and external ear normal.     Ears:     Comments: Significant eustachian tube  dysfunction noted bilaterally    Nose:     Right Sinus: Maxillary sinus tenderness present.     Left Sinus: Maxillary sinus tenderness present.     Comments: Turbinates are erythematous/edematous    Mouth/Throat:     Mouth: Mucous membranes are moist.     Pharynx: Oropharynx is clear.     Comments: Significant amount of clear drainage of posterior oropharynx noted Eyes:     Extraocular Movements: Extraocular movements intact.     Conjunctiva/sclera: Conjunctivae normal.     Pupils: Pupils are equal, round, and reactive to [place].  Cardiovascular:     Rate and Rhythm: Normal rate and regular rhythm.     Pulses: Normal pulses.     Heart sounds: Normal heart sounds.  Pulmonary:     Effort: Pulmonary effort is normal.     Breath sounds: Normal breath sounds. No wheezing, rhonchi or rales.  Musculoskeletal:        General: Normal range of motion.     Cervical back: Normal range of motion and neck supple.  Skin:    General: Skin is warm and dry.  Neurological:     General: No focal deficit present.     Mental Status: She is alert and oriented to person, place, and time. Mental status is at baseline.  Psychiatric:        Mood and Affect: Mood normal.        Behavior: Behavior normal.        Thought Content: Thought content normal.      UC Treatments / Results  Labs (all labs ordered are listed, but only abnormal results are displayed) Labs Reviewed - No data to display  EKG   Radiology No results found.  Procedures Procedures (including critical care time)  Medications  Ordered in UC Medications - No data to display  Initial Impression / Assessment and Plan / UC Course  I have reviewed the triage vital signs and the nursing notes.  Pertinent labs & imaging results that were available during my care of the patient were reviewed by me and considered in my medical decision making (see chart for details).     MDM: 1.  Acute maxillary sinusitis, recurrence not specified-Rx'd Augmentin 875/125 mg tablet twice daily x 10 days; 2.  Congestion of nasal sinus-Rx'd prednisone 60 mg daily x 5 days; 3.  Cough, unspecified type-Rx'd Tessalon Perles 200 mg 3 times daily, as needed for cough; 4.  Allergic rhinitis, unspecified seasonality, unspecified trigger-Rx'd Allegra 180 mg daily x 5 days, as needed. Instructed patient to discontinue Claritin.  Advised patient to take medication as directed with food to completion.  Advised patient to take prednisone and Allegra with first dose of Augmentin for the next 5 of 10 days.  Advised may use Allegra as needed afterwards for concurrent postnasal drainage/drip.  Advised may use Tessalon daily or as needed for cough.  Encouraged increase daily water intake to 64 ounces per day while taking these medications.  Patient discharged home, hemodynamically stable.  Final Clinical Impressions(s) / UC Diagnoses   Final diagnoses:  Acute maxillary sinusitis, recurrence not specified  Cough, unspecified type  Allergic rhinitis, unspecified seasonality, unspecified trigger  Congestion of nasal sinus     Discharge Instructions      Instructed patient to discontinue Claritin.  Advised patient to take medication as directed with food to completion.  Advised patient to take prednisone and Allegra with first dose of Augmentin for the next 5 of 10 days.  Advised may use Allegra as needed afterwards for concurrent postnasal drainage/drip.  Advised may use Tessalon daily or as needed for cough.  Encouraged increase daily water intake to 64 ounces  per day while taking these medications.     ED Prescriptions     Medication Sig Dispense Auth. Provider   amoxicillin-clavulanate (AUGMENTIN) 875-125 MG tablet Take 1 tablet by mouth 2 (two) times daily for 10 days. 20 tablet Trevor Iha, FNP   predniSONE (DELTASONE) 20 MG tablet Take 3 tabs PO daily x 5 days. 15 tablet Trevor Iha, FNP   fexofenadine [facility] ALLERGY) 180 MG tablet Take 1 tablet (180 mg total) by mouth daily for 15 days. 15 tablet Trevor Iha, FNP   benzonatate (TESSALON) 200 MG capsule Take 1 capsule (200 mg total) by mouth 3 (three) times daily as needed for up to 7 days. 40 capsule Trevor Iha, FNP      PDMP not reviewed this encounter.   Trevor Iha, FNP 09/19/22 [phone]

## 2022-09-19 NOTE — ED Triage Notes (Signed)
Pt c/o cough, sinus pressure/congestion and sore throat since Tues night. Dayquil, flonase and tyenol prn. Also takes Claritin daily. Hx of seasonal allergies.

## 2023-02-13 ENCOUNTER — Ambulatory Visit: Payer: 59

## 2023-02-13 ENCOUNTER — Other Ambulatory Visit: Payer: Self-pay

## 2023-02-13 ENCOUNTER — Ambulatory Visit
Admission: EM | Admit: 2023-02-13 | Discharge: 2023-02-13 | Disposition: A | Discharge status: Home / Self Care | Payer: 59 | Attending: Family Medicine | Admitting: Family Medicine

## 2023-02-13 DIAGNOSIS — S9032XA Contusion of left foot, initial encounter: Secondary | ICD-10-CM

## 2023-02-13 DIAGNOSIS — M79672 Pain in left foot: Secondary | ICD-10-CM | POA: Diagnosis not present

## 2023-02-13 NOTE — ED Triage Notes (Signed)
Yesterday fell off front porch, injuring left heel. Has pain and difficulty walking.

## 2023-02-13 NOTE — Discharge Instructions (Signed)
Take ibuprofen or naproxen for pain Limit walking while heel painful See sports medicine if pain persists

## 2023-02-13 NOTE — ED Provider Notes (Signed)
[facility]    CSN: [id number] Arrival date & time: 02/13/23  0802      History   Chief Complaint Chief Complaint  Patient presents with   Foot Pain    HPI Tammy Gray is a 57 y.o. female.   HPI  Patient states that she was arranging positive some pumpkins on her porch when she stepped backwards and missed the bottom step.  Her heel hit forcibly on the sidewalk 2 steps below when she fell.  Since then she has been unable to bear weight on the left foot and heel.  Range of motion of ankle hurts when Achilles pulls on back of heel. Patient has a history of breast cancer.  Has no known osteopenia or porosis.  Past Medical History:  Diagnosis Date   Breast cancer of upper-outer quadrant of left female breast (HCC) 12/25/2015   Genetic testing 09/28/2016   Ms. Raz underwent genetic counseling and testing for hereditary cancer syndromes on 09/10/2016. Her results were negative for mutations in all 46 genes analyzed by [facility]'s 46-gene Common Hereditary Cancers Panel. Genes analyzed include: APC, ATM, AXIN2, BARD1, BMPR1A, BRCA1, BRCA2, BRIP1, CDH1, CDKN2A, CHEK2, CTNNA1, DICER1, EPCAM, GREM1, HOXB13, KIT, MEN1, MLH1, MSH2, MSH3, MSH6, MUTYH, NBN,    Herpes 1987   Hypertension    IBS (irritable bowel syndrome)     Patient Active Problem List   Diagnosis Date Noted   Genetic testing 09/28/2016   Ductal carcinoma in situ (DCIS) of left breast 02/12/2016   Malignant neoplasm of upper-outer quadrant of left breast in female, estrogen receptor positive (HCC) 12/25/2015    Past Surgical History:  Procedure Laterality Date   BREAST BIOPSY Left 2017   BREAST IMPLANT EXCHANGE Left 07/24/2016   Procedure: REVISION OF LEFT BREAST RECONSTRUCTION WITH LEFT SALINE IMPLANT EXCHANGE AND ALODERM TO LEFT CHEST;  Surgeon: Glenna Fellows, MD;  Location: [facility];  Service: Plastics;  Laterality: Left;   BREAST RECONSTRUCTION WITH PLACEMENT OF TISSUE EXPANDER AND  FLEX HD (ACELLULAR HYDRATED DERMIS) Left 01/28/2016   Procedure: LEFT BREAST RECONSTRUCTION WITH PLACEMENT OF TISSUE EXPANDER AND CORTIVA;  Surgeon: Glenna Fellows, MD;  Location: [facility];  Service: Plastics;  Laterality: Left;   CESAREAN SECTION     COLONOSCOPY N/A 02/11/2017   Procedure: SCREENING COLONOSCOPY;  Surgeon: Romie Levee, MD;  Location: [facility];  Service: Endoscopy;  Laterality: N/A;   LIPOSUCTION WITH LIPOFILLING Left 04/10/2016   Procedure: LIPOFILLING from abdomen to left chest;  Surgeon: Glenna Fellows, MD;  Location: [facility];  Service: Plastics;  Laterality: Left;   LIPOSUCTION WITH LIPOFILLING Left 07/24/2016   Procedure: LIPOFILLING FROM ABDOMEN TO LEFT CHEST;  Surgeon: Glenna Fellows, MD;  Location: [facility];  Service: Plastics;  Laterality: Left;   MASTECTOMY Left 2017   MASTECTOMY W/ SENTINEL NODE BIOPSY Left 01/28/2016   Procedure: LEFT MASTECTOMY WITH SENTINEL LYMPH NODE BIOPSY;  Surgeon: Almond Lint, MD;  Location: [facility];  Service: General;  Laterality: Left;   MASTOPEXY Right 04/10/2016   Procedure: MASTOPEXY;  Surgeon: Glenna Fellows, MD;  Location: [facility];  Service: Plastics;  Laterality: Right;   REDUCTION MAMMAPLASTY Right 2017   REMOVAL OF TISSUE EXPANDER AND PLACEMENT OF IMPLANT Left 04/10/2016   Procedure: REMOVAL OF TISSUE EXPANDER AND PLACEMENT OF IMPLANT;  Surgeon: Glenna Fellows, MD;  Location: [facility];  Service: Plastics;  Laterality: Left;    OB History   No  obstetric history on file.      Home Medications    Prior to Admission medications   Medication Sig Start Date End Date Taking? Authorizing Provider  fexofenadine (ALLEGRA ALLERGY) 180 MG tablet Take 1 tablet (180 mg total) by mouth daily for 15 days. 09/19/22 10/04/22  Trevor Iha, FNP  hydrochlorothiazide (HYDRODIURIL) 12.5 MG tablet Take 12.5 mg by mouth daily.     [provider]  lisinopril (PRINIVIL,ZESTRIL) 5 MG tablet Take 5 mg by mouth daily.    [provider]  Multiple Vitamin (MULTIVITAMIN WITH MINERALS) TABS tablet Take 1 tablet by mouth at bedtime.    [provider]  valACYclovir (VALTREX) 500 MG tablet Take by mouth. 01/06/16   [provider]    Family History Family History  Problem Relation Age of Onset   Other Mother 61       hx of hysterectomy to treat endometriosis and heavy bleeding   Prostate cancer Father 28       unspecified Gleason score; +prostatectomy   Colon cancer Paternal Grandmother        dx late 57s; w/ mets, d. 21   Colon cancer Paternal Grandfather        dx. late 23s   Stroke Maternal Grandmother    Diverticulitis Maternal Grandmother    Hodgkin's lymphoma Cousin        maternal 1st cousin dx. in his 46s-30s   Breast cancer Cousin        paternal 1st cousin, once-removed (on PGF's side of family) dx late 75s    Social History Social History   Tobacco Use   Smoking status: Never   Smokeless tobacco: Never  Substance Use Topics   Alcohol use: No   Drug use: No     Allergies   Patient has no known allergies.   Review of Systems Review of Systems See HPI  Physical Exam Triage Vital Signs ED Triage Vitals  Encounter Vitals Group     BP 02/13/23 0813 (!) 152/78     Systolic BP Percentile --      Diastolic BP Percentile --      Pulse Rate 02/13/23 0813 61     Resp 02/13/23 0813 16     Temp 02/13/23 0813 98.1 F (36.7 C)     Temp src --      SpO2 02/13/23 0813 99 %     Weight --      Height --      Head Circumference --      Peak Flow --      Pain Score 02/13/23 0816 9     Pain Loc --      Pain Education --      Exclude from Growth Chart --    No data found.  Updated Vital Signs BP (!) 152/78   Pulse 61   Temp 98.1 F (36.7 C)   Resp 16   SpO2 99%      Physical Exam Constitutional:      General: She is not in acute distress.     Appearance: She is well-developed.  HENT:     Head: Normocephalic and atraumatic.  Eyes:     Conjunctiva/sclera: Conjunctivae normal.     Pupils: Pupils are equal, round, and reactive to [place].  Cardiovascular:     Rate and Rhythm: Normal rate.  Pulmonary:     Effort: Pulmonary effort is normal. No respiratory distress.  Abdominal:     General: There is no  distension.     Palpations: Abdomen is soft.  Musculoskeletal:        General: Tenderness and signs of injury present. Normal range of motion.     Cervical back: Normal range of motion.     Comments: The left heel appears normal.  No soft tissue swelling is appreciated.  No bruising is appreciated.  There is tenderness to squeeze pressure of the calcaneus, to palpation of the Achilles tendon insertion, and on the plantar surface of the heel.  Skin:    General: Skin is warm and dry.  Neurological:     Mental Status: She is alert.     Gait: Gait abnormal.      UC Treatments / Results  Labs (all labs ordered are listed, but only abnormal results are displayed) Labs Reviewed - No data to display  EKG   Radiology No results found.  Procedures Procedures (including critical care time)  Medications Ordered in UC Medications - No data to display  Initial Impression / Assessment and Plan / UC Course  I have reviewed the triage vital signs and the nursing notes.  Pertinent labs & imaging results that were available during my care of the patient were reviewed by me and considered in my medical decision making (see chart for details).     X-rays are reviewed in the absence of radiology due to breathing delays.  To my review there is no fracture.  This is discussed with patient Final Clinical Impressions(s) / UC Diagnoses   Final diagnoses:  Pain of left heel  Contusion of foot or heel, left, initial encounter     Discharge Instructions      Take ibuprofen or naproxen for pain Limit walking while heel painful See  sports medicine if pain persists    ED Prescriptions   None    PDMP not reviewed this encounter.   Eustace Moore, MD 02/13/23 [phone]

## 2023-05-07 ENCOUNTER — Other Ambulatory Visit: Payer: Self-pay | Admitting: Obstetrics & Gynecology

## 2023-05-07 DIAGNOSIS — Z1231 Encounter for screening mammogram for malignant neoplasm of breast: Secondary | ICD-10-CM

## 2023-05-25 ENCOUNTER — Ambulatory Visit
Admission: RE | Admit: 2023-05-25 | Discharge: 2023-05-25 | Disposition: A | Discharge status: Home / Self Care | Payer: 59 | Source: Ambulatory Visit | Attending: Obstetrics & Gynecology | Admitting: Obstetrics & Gynecology

## 2023-05-25 DIAGNOSIS — Z1231 Encounter for screening mammogram for malignant neoplasm of breast: Secondary | ICD-10-CM

## 2023-06-04 ENCOUNTER — Telehealth: Payer: 59 | Admitting: Physician Assistant

## 2023-06-04 DIAGNOSIS — B9689 Other specified bacterial agents as the cause of diseases classified elsewhere: Secondary | ICD-10-CM

## 2023-06-04 DIAGNOSIS — J069 Acute upper respiratory infection, unspecified: Secondary | ICD-10-CM

## 2023-06-04 MED ORDER — AMOXICILLIN-POT CLAVULANATE 875-125 MG PO TABS: 1.0000 | ORAL_TABLET | Freq: Two times a day (BID) | ORAL | 0 refills | Status: DC

## 2023-06-04 MED ORDER — PROMETHAZINE-DM 6.25-15 MG/5ML PO SYRP: 5.0000 mL | ORAL_SOLUTION | Freq: Four times a day (QID) | ORAL | 0 refills | Status: AC | PRN

## 2023-06-04 MED ORDER — BENZONATATE 100 MG PO CAPS
100.0000 mg | ORAL_CAPSULE | Freq: Three times a day (TID) | ORAL | 0 refills | Status: AC | PRN
Start: 2023-06-04 — End: ?

## 2023-06-04 NOTE — Progress Notes (Signed)
Virtual Visit Consent   Daneen Schick, you are scheduled for a virtual visit with a [facility] provider today. Just as with appointments in the office, your consent must be obtained to participate. Your consent will be active for this visit and any virtual visit you may have with one of our providers in the next 365 days. If you have a MyChart account, a copy of this consent can be sent to you electronically.  As this is a virtual visit, video technology does not allow for your provider to perform a traditional examination. This may limit your provider's ability to fully assess your condition. If your provider identifies any concerns that need to be evaluated in person or the need to arrange testing (such as labs, EKG, etc.), we will make arrangements to do so. Although advances in technology are sophisticated, we cannot ensure that it will always work on either your end or our end. If the connection with a video visit is poor, the visit may have to be switched to a telephone visit. With either a video or telephone visit, we are not always able to ensure that we have a secure connection.  By engaging in this virtual visit, you consent to the provision of healthcare and authorize for your insurance to be billed (if applicable) for the services provided during this visit. Depending on your insurance coverage, you may receive a charge related to this service.  I need to obtain your verbal consent now. Are you willing to proceed with your visit today? AVALEY COOP has provided verbal consent on 06/04/2023 for a virtual visit (video or telephone). Margaretann Loveless, PA-C  Date: 06/04/2023 8:46 AM   Virtual Visit via Video Note   I, Margaretann Loveless, connected with  SIDRA OLDFIELD  ([id number], 08-Feb-1966) on 06/04/23 at  8:30 AM EST by a video-enabled telemedicine application and verified that I am speaking with the correct person using two identifiers.  Location: Patient: Virtual Visit Location  Patient: Home Provider: Virtual Visit Location Provider: Home Office   I discussed the limitations of evaluation and management by telemedicine and the availability of in person appointments. The patient expressed understanding and agreed to proceed.    History of Present Illness: Tammy Gray is a 58 y.o. who identifies as a female who was assigned female at birth, and is being seen today for cough and congestion.  HPI: Sinusitis This is a new problem. The current episode started 1 to 4 weeks ago (1 week). The problem has been gradually worsening since onset. Maximum temperature: 99. The fever has been present for 3 to 4 days. The pain is moderate. Associated symptoms include chills, congestion, coughing, ear pain (mild; muffled), headaches, a hoarse voice (congested sounding), sinus pressure and a sore throat (scratchy; becoming more sore from cough). Pertinent negatives include no shortness of breath. Treatments tried: dayquil, nyquil. The treatment provided mild relief.     Problems:  Patient Active Problem List   Diagnosis Date Noted   Genetic testing 09/28/2016   Ductal carcinoma in situ (DCIS) of left breast 02/12/2016   Malignant neoplasm of upper-outer quadrant of left breast in female, estrogen receptor positive (HCC) 12/25/2015    Allergies: No Known Allergies Medications:  Current Outpatient Medications:    amoxicillin-clavulanate (AUGMENTIN) 875-125 MG tablet, Take 1 tablet by mouth 2 (two) times daily., Disp: 20 tablet, Rfl: 0   benzonatate (TESSALON) 100 MG capsule, Take 1-2 capsules (100-200 mg total) by mouth 3 (three) times  daily as needed., Disp: 30 capsule, Rfl: 0   promethazine-dextromethorphan (PROMETHAZINE-DM) 6.25-15 MG/5ML syrup, Take 5 mLs by mouth 4 (four) times daily as needed., Disp: 118 mL, Rfl: 0   fexofenadine (ALLEGRA ALLERGY) 180 MG tablet, Take 1 tablet (180 mg total) by mouth daily for 15 days., Disp: 15 tablet, Rfl: 0   hydrochlorothiazide (HYDRODIURIL)  12.5 MG tablet, Take 12.5 mg by mouth daily., Disp: , Rfl:    lisinopril (PRINIVIL,ZESTRIL) 5 MG tablet, Take 5 mg by mouth daily., Disp: , Rfl:    Multiple Vitamin (MULTIVITAMIN WITH MINERALS) TABS tablet, Take 1 tablet by mouth at bedtime., Disp: , Rfl:    valACYclovir (VALTREX) 500 MG tablet, Take by mouth., Disp: , Rfl:   Observations/Objective: Patient is well-developed, well-nourished in no acute distress.  Resting comfortably at home.  Head is normocephalic, atraumatic.  No labored breathing.  Speech is clear and coherent with logical content.  Patient is alert and oriented at baseline.    Assessment and Plan: 1. Bacterial upper respiratory infection (Primary) - amoxicillin-clavulanate (AUGMENTIN) 875-125 MG tablet; Take 1 tablet by mouth 2 (two) times daily.  Dispense: 20 tablet; Refill: 0 - benzonatate (TESSALON) 100 MG capsule; Take 1-2 capsules (100-200 mg total) by mouth 3 (three) times daily as needed.  Dispense: 30 capsule; Refill: 0 - promethazine-dextromethorphan (PROMETHAZINE-DM) 6.25-15 MG/5ML syrup; Take 5 mLs by mouth 4 (four) times daily as needed.  Dispense: 118 mL; Refill: 0  - Worsening over a week despite OTC medications - Will treat with Augmentin, Promethazine DM and tessalon perles - Can continue Mucinex (PLAIN)  - Push fluids.  - Rest.  - Steam and humidifier can help - Seek in person evaluation if worsening or symptoms fail to improve    Follow Up Instructions: I discussed the assessment and treatment plan with the patient. The patient was provided an opportunity to ask questions and all were answered. The patient agreed with the plan and demonstrated an understanding of the instructions.  A copy of instructions were sent to the patient via MyChart unless otherwise noted below.    The patient was advised to call back or seek an in-person evaluation if the symptoms worsen or if the condition fails to improve as anticipated.    Margaretann Loveless,  PA-C

## 2023-06-04 NOTE — Patient Instructions (Signed)
Tammy Gray, thank you for joining Margaretann Loveless, PA-C for today's virtual visit.  While this provider is not your primary care provider (PCP), if your PCP is located in our provider database this encounter information will be shared with them immediately following your visit.   A [facility] MyChart account gives you access to today's visit and all your visits, tests, and labs performed at [facility] " click here if you don't have a [facility] MyChart account or go to [email or web address]  Consent: (Patient) Tammy Gray provided verbal consent for this virtual visit at the beginning of the encounter.  Current Medications:  Current Outpatient Medications:    amoxicillin-clavulanate (AUGMENTIN) 875-125 MG tablet, Take 1 tablet by mouth 2 (two) times daily., Disp: 20 tablet, Rfl: 0   benzonatate (TESSALON) 100 MG capsule, Take 1-2 capsules (100-200 mg total) by mouth 3 (three) times daily as needed., Disp: 30 capsule, Rfl: 0   promethazine-dextromethorphan (PROMETHAZINE-DM) 6.25-15 MG/5ML syrup, Take 5 mLs by mouth 4 (four) times daily as needed., Disp: 118 mL, Rfl: 0   fexofenadine (ALLEGRA ALLERGY) 180 MG tablet, Take 1 tablet (180 mg total) by mouth daily for 15 days., Disp: 15 tablet, Rfl: 0   hydrochlorothiazide (HYDRODIURIL) 12.5 MG tablet, Take 12.5 mg by mouth daily., Disp: , Rfl:    lisinopril (PRINIVIL,ZESTRIL) 5 MG tablet, Take 5 mg by mouth daily., Disp: , Rfl:    Multiple Vitamin (MULTIVITAMIN WITH MINERALS) TABS tablet, Take 1 tablet by mouth at bedtime., Disp: , Rfl:    valACYclovir (VALTREX) 500 MG tablet, Take by mouth., Disp: , Rfl:    Medications ordered in this encounter:  Meds ordered this encounter  Medications   amoxicillin-clavulanate (AUGMENTIN) 875-125 MG tablet    Sig: Take 1 tablet by mouth 2 (two) times daily.    Dispense:  20 tablet    Refill:  0    Supervising Provider:   Merrilee Jansky [[id number]]   benzonatate (TESSALON) 100 MG  capsule    Sig: Take 1-2 capsules (100-200 mg total) by mouth 3 (three) times daily as needed.    Dispense:  30 capsule    Refill:  0    Supervising Provider:   Merrilee Jansky [[id number]]   promethazine-dextromethorphan (PROMETHAZINE-DM) 6.25-15 MG/5ML syrup    Sig: Take 5 mLs by mouth 4 (four) times daily as needed.    Dispense:  118 mL    Refill:  0    Supervising Provider:   Merrilee Jansky [[id number]]     *If you need refills on other medications prior to your next appointment, please contact your pharmacy*  Follow-Up: Call back or seek an in-person evaluation if the symptoms worsen or if the condition fails to improve as anticipated.  [facility] [phone]  Other Instructions  Upper Respiratory Infection, Adult An upper respiratory infection (URI) is a common viral infection of the nose, throat, and upper air passages that lead to the lungs. The most common type of URI is the common cold. URIs usually get better on their own, without medical treatment. What are the causes? A URI is caused by a virus. You may catch a virus by: Breathing in droplets from an infected person's cough or sneeze. Touching something that has been exposed to the virus (is contaminated) and then touching your mouth, nose, or eyes. What increases the risk? You are more likely to get a URI if: You are very young or very old. You have close  contact with others, such as at work, school, or a health care facility. You smoke. You have long-term (chronic) heart or lung disease. You have a weakened disease-fighting system (immune system). You have nasal allergies or asthma. You are experiencing a lot of stress. You have poor nutrition. What are the signs or symptoms? A URI usually involves some of the following symptoms: Runny or stuffy (congested) nose. Cough. Sneezing. Sore throat. Headache. Fatigue. Fever. Loss of appetite. Pain in your forehead, behind your eyes, and over your  cheekbones (sinus pain). Muscle aches. Redness or irritation of the eyes. Pressure in the ears or face. How is this diagnosed? This condition may be diagnosed based on your medical history and symptoms, and a physical exam. Your health care provider may use a swab to take a mucus sample from your nose (nasal swab). This sample can be tested to determine what virus is causing the illness. How is this treated? URIs usually get better on their own within 7-10 days. Medicines cannot cure URIs, but your health care provider may recommend certain medicines to help relieve symptoms, such as: Over-the-counter cold medicines. Cough suppressants. Coughing is a type of defense against infection that helps to clear the respiratory system, so take these medicines only as recommended by your health care provider. Fever-reducing medicines. Follow these instructions at home: Activity Rest as needed. If you have a fever, stay home from work or school until your fever is gone or until your health care provider says your URI cannot spread to other people (is no longer contagious). Your health care provider may have you wear a face mask to prevent your infection from spreading. Relieving symptoms Gargle with a mixture of salt and water 3-4 times a day or as needed. To make salt water, completely dissolve -1 tsp (3-6 g) of salt in 1 cup (237 mL) of warm water. Use a cool-mist humidifier to add moisture to the air. This can help you breathe more easily. Eating and drinking  Drink enough fluid to keep your urine pale yellow. Eat soups and other clear broths. General instructions  Take over-the-counter and prescription medicines only as told by your health care provider. These include cold medicines, fever reducers, and cough suppressants. Do not use any products that contain nicotine or tobacco. These products include cigarettes, chewing tobacco, and vaping devices, such as e-cigarettes. If you need help  quitting, ask your health care provider. Stay away from secondhand smoke. Stay up to date on all immunizations, including the yearly (annual) flu vaccine. Keep all follow-up visits. This is important. How to prevent the spread of infection to others URIs can be contagious. To prevent the infection from spreading: Wash your hands with soap and water for at least 20 seconds. If soap and water are not available, use hand sanitizer. Avoid touching your mouth, face, eyes, or nose. Cough or sneeze into a tissue or your sleeve or elbow instead of into your hand or into the air.  Contact a health care provider if: You are getting worse instead of better. You have a fever or chills. Your mucus is brown or red. You have yellow or brown discharge coming from your nose. You have pain in your face, especially when you bend forward. You have swollen neck glands. You have pain while swallowing. You have white areas in the back of your throat. Get help right away if: You have shortness of breath that gets worse. You have severe or persistent: Headache. Ear pain. Sinus pain. Chest  pain. You have chronic lung disease along with any of the following: Making high-pitched whistling sounds when you breathe, most often when you breathe out (wheezing). Prolonged cough (more than 14 days). Coughing up blood. A change in your usual mucus. You have a stiff neck. You have changes in your: Vision. Hearing. Thinking. Mood. These symptoms may be an emergency. Get help right away. Call 911. Do not wait to see if the symptoms will go away. Do not drive yourself to the hospital. Summary An upper respiratory infection (URI) is a common infection of the nose, throat, and upper air passages that lead to the lungs. A URI is caused by a virus. URIs usually get better on their own within 7-10 days. Medicines cannot cure URIs, but your health care provider may recommend certain medicines to help relieve  symptoms. This information is not intended to replace advice given to you by your health care provider. Make sure you discuss any questions you have with your health care provider. Document Revised: 11/06/2020 Document Reviewed: 11/06/2020 Elsevier Patient Education  2024 [employer]   If you have been instructed to have an in-person evaluation today at a local Urgent Care facility, please use the link below. It will take you to a list of all of our available [facility], including address, phone number and hours of operation. Please do not delay care.  [facility]  If you or a family member do not have a primary care provider, use the link below to schedule a visit and establish care. When you choose a [facility] primary care physician or advanced practice provider, you gain a long-term partner in health. Find a Primary Care Provider  Learn more about [facility] in-office and virtual care options: [facility] - Get Care Now

## 2023-08-06 IMAGING — MG MM DIGITAL SCREENING UNILAT*R* W/ TOMO W/ CAD
4 series · 4 of 12 positions shown · non-contrast
Comparison: Previous exam(s).

CLINICAL DATA: Screening.

EXAM:
DIGITAL SCREENING UNILATERAL RIGHT MAMMOGRAM WITH CAD AND
TOMOSYNTHESIS
TECHNIQUE: Right screening digital craniocaudal and mediolateral oblique
mammograms were obtained. Right screening digital breast
tomosynthesis was performed. The images were evaluated with
computer-aided detection.

[R CC synth-2D]
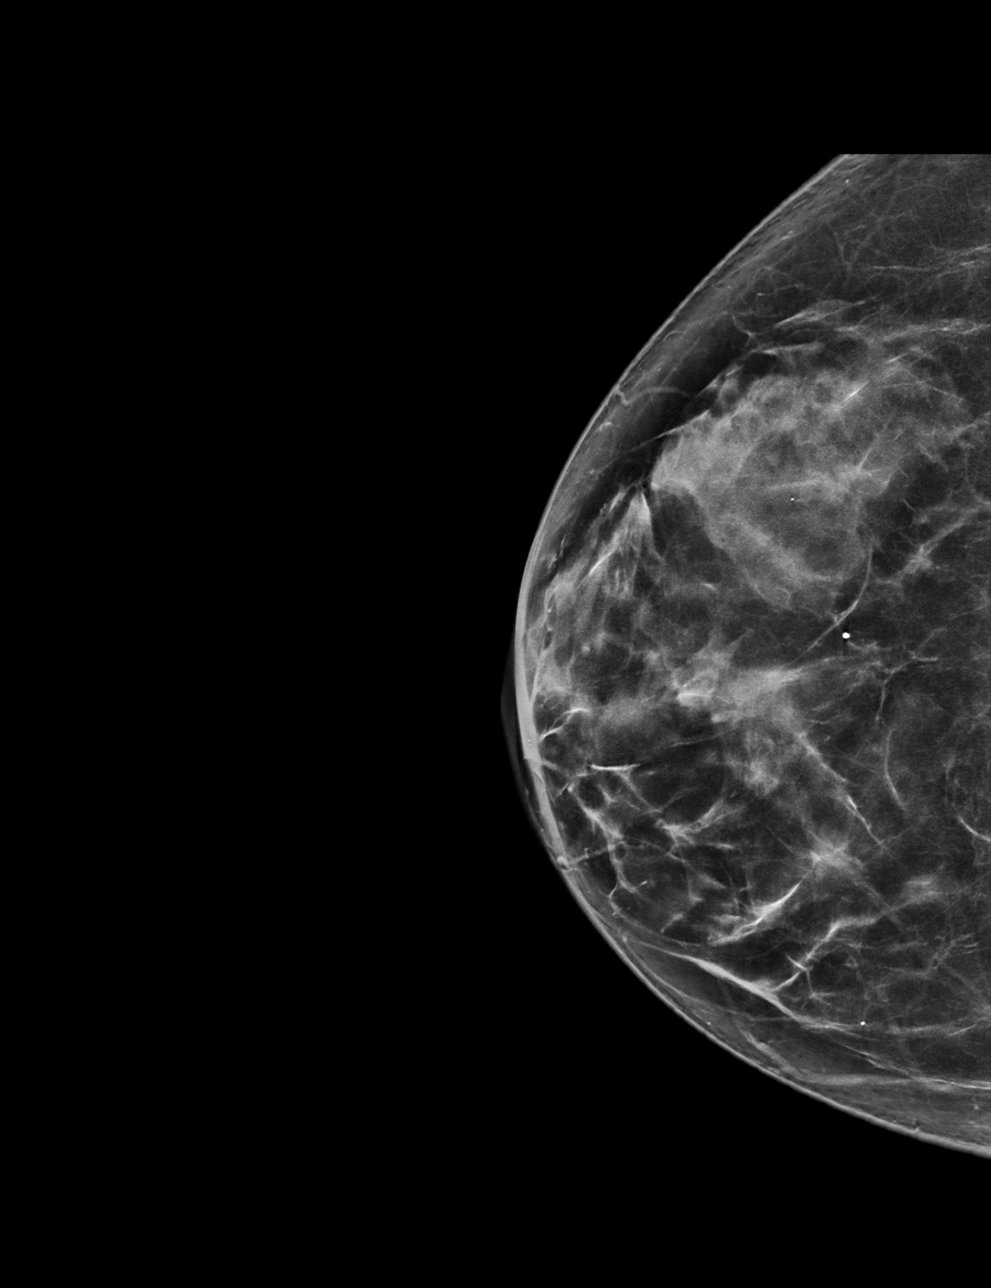

[R MLO synth-2D]
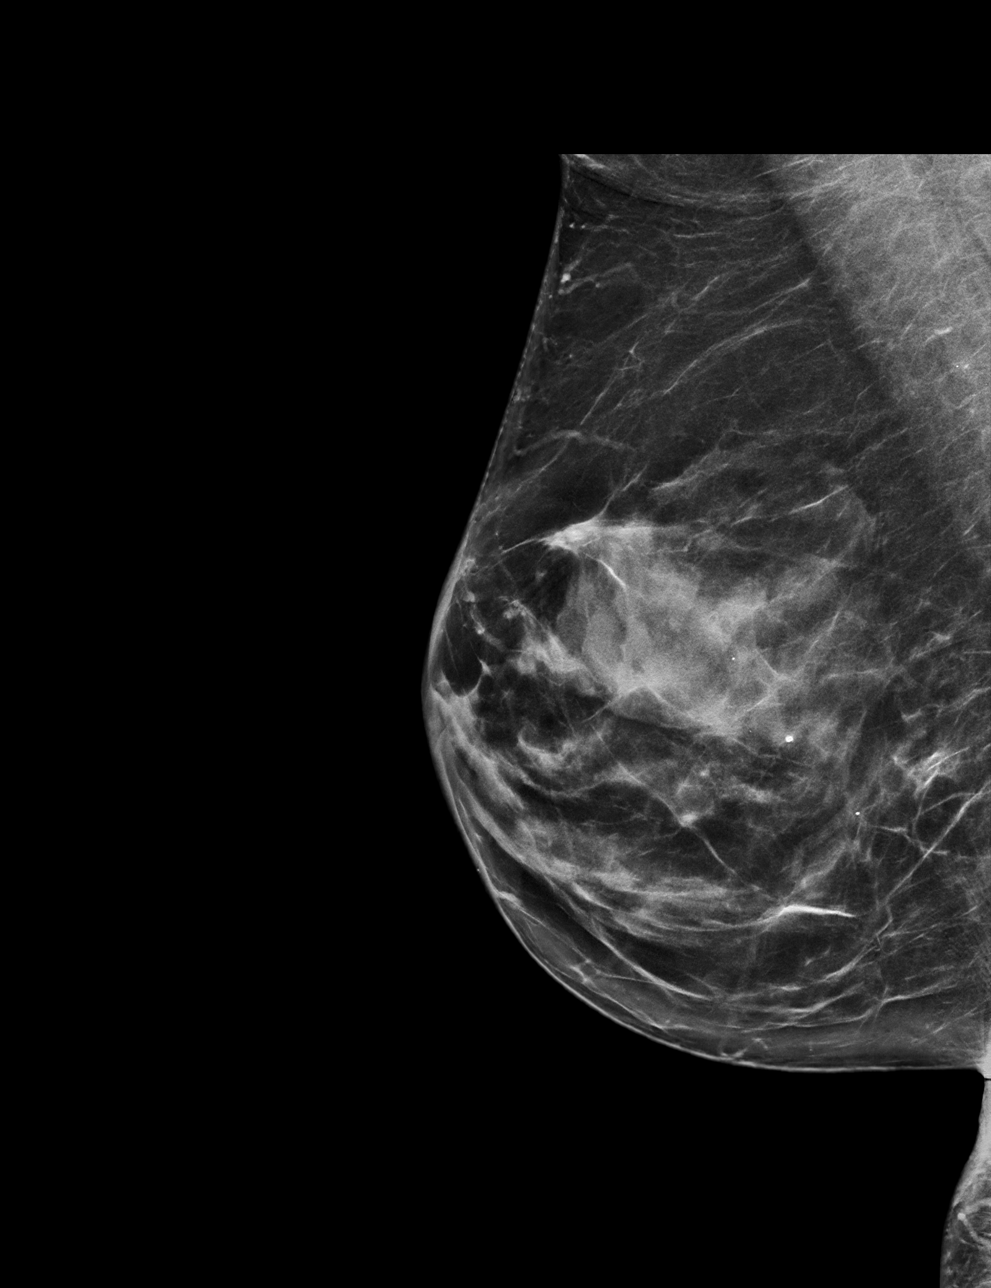

[R MLO tomo · tomo slice 38/75.0]
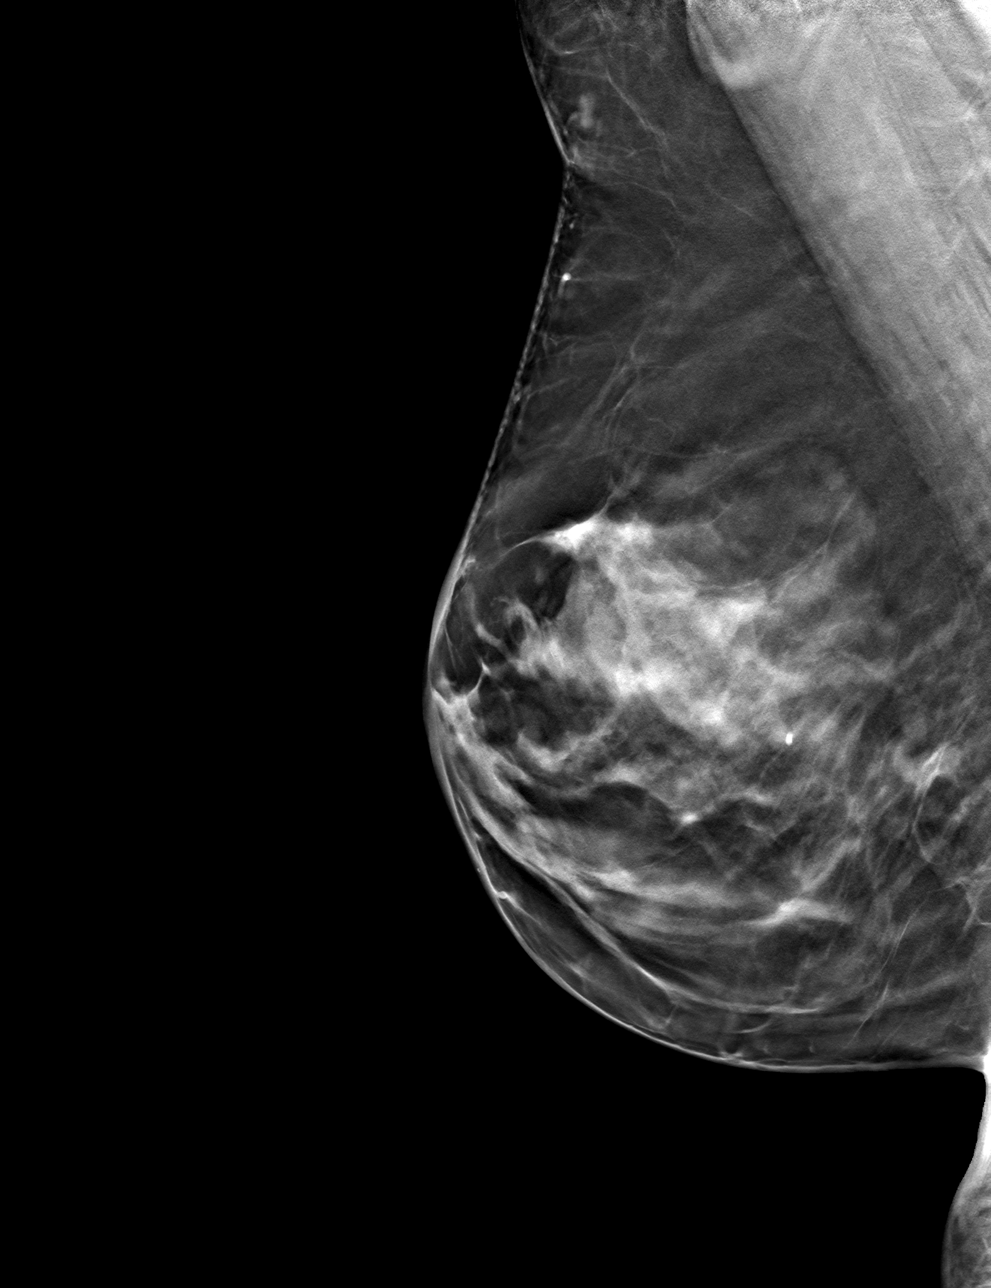

[R CC tomo · tomo slice 35/70.0]
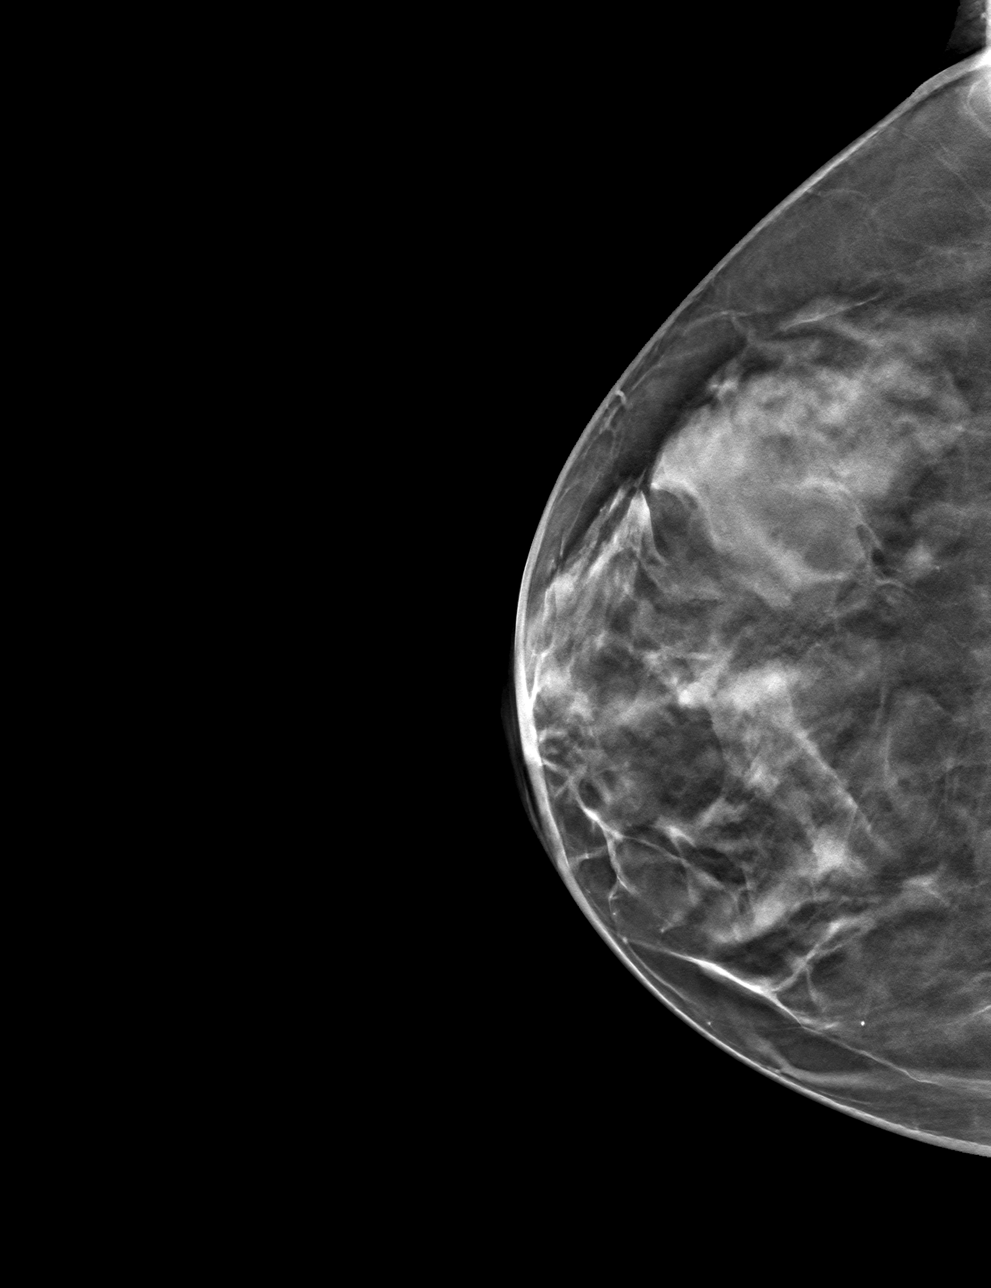

[4 of 12 positions shown; findings below may reference images not displayed]

ACR Breast Density Category c: The breast tissue is heterogeneously
dense, which may obscure small masses.
FINDINGS: There are no findings suspicious for malignancy. Status post LEFT
mastectomy. Post reduction changes of the RIGHT breast.
IMPRESSION: No mammographic evidence of malignancy. A result letter of this
screening mammogram will be mailed directly to the patient.

RECOMMENDATION:
Screening mammogram in one year. (Code:[id number])

Given history of breast cancer at a young age, recommend
consideration of high risk breast cancer screening. The American
Cancer Society recommends annual MRI and mammography in patients
with an estimated lifetime risk of developing breast cancer greater
than 20 - 25%, or who are known or suspected to be positive for the
breast cancer gene.

BI-RADS CATEGORY  2: Benign.

## 2024-03-10 ENCOUNTER — Telehealth: Admitting: Physician Assistant

## 2024-03-10 DIAGNOSIS — B9689 Other specified bacterial agents as the cause of diseases classified elsewhere: Secondary | ICD-10-CM

## 2024-03-10 DIAGNOSIS — J019 Acute sinusitis, unspecified: Secondary | ICD-10-CM | POA: Diagnosis not present

## 2024-03-10 MED ORDER — AMOXICILLIN-POT CLAVULANATE 875-125 MG PO TABS: 1.0000 | ORAL_TABLET | Freq: Two times a day (BID) | ORAL | 0 refills | Status: AC

## 2024-03-10 NOTE — Patient Instructions (Signed)
 Tammy Gray, thank you for joining [name], PA-C for today's virtual visit.  While this provider is not your primary care provider (PCP), if your PCP is located in our provider database this encounter information will be shared with them immediately following your visit.   A [facility] MyChart account gives you access to today's visit and all your visits, tests, and labs performed at [facility]  click here if you don't have a [facility] MyChart account or go to [email or web address]  Consent: (Patient) Tammy Gray provided verbal consent for this virtual visit at the beginning of the encounter.  Current Medications:  Current Outpatient Medications:    amoxicillin -clavulanate (AUGMENTIN ) 875-125 MG tablet, Take 1 tablet by mouth 2 (two) times daily., Disp: 14 tablet, Rfl: 0   benzonatate  (TESSALON ) 100 MG capsule, Take 1-2 capsules (100-200 mg total) by mouth 3 (three) times daily as needed., Disp: 30 capsule, Rfl: 0   fexofenadine  (ALLEGRA  ALLERGY) 180 MG tablet, Take 1 tablet (180 mg total) by mouth daily for 15 days., Disp: 15 tablet, Rfl: 0   hydrochlorothiazide  (HYDRODIURIL ) 12.5 MG tablet, Take 12.5 mg by mouth daily., Disp: , Rfl:    lisinopril  (PRINIVIL ,ZESTRIL ) 5 MG tablet, Take 5 mg by mouth daily., Disp: , Rfl:    Multiple Vitamin (MULTIVITAMIN WITH MINERALS) TABS tablet, Take 1 tablet by mouth at bedtime., Disp: , Rfl:    promethazine -dextromethorphan (PROMETHAZINE -DM) 6.25-15 MG/5ML syrup, Take 5 mLs by mouth 4 (four) times daily as needed., Disp: 118 mL, Rfl: 0   valACYclovir (VALTREX) 500 MG tablet, Take by mouth., Disp: , Rfl:    Medications ordered in this encounter:  Meds ordered this encounter  Medications   amoxicillin -clavulanate (AUGMENTIN ) 875-125 MG tablet    Sig: Take 1 tablet by mouth 2 (two) times daily.    Dispense:  14 tablet    Refill:  0    Supervising Provider:   BLAISE ALEENE KIDD [[id number]]     *If you need refills on  other medications prior to your next appointment, please contact your pharmacy*  Follow-Up: Call back or seek an in-person evaluation if the symptoms worsen or if the condition fails to improve as anticipated.   Virtual Care [phone]  Other Instructions Sinus Infection, Adult A sinus infection, also called sinusitis, is inflammation of your sinuses. Sinuses are hollow spaces in the bones around your face. Your sinuses are located: Around your eyes. In the middle of your forehead. Behind your nose. In your cheekbones. Mucus normally drains out of your sinuses. When your nasal tissues become inflamed or swollen, mucus can become trapped or blocked. This allows bacteria, viruses, and fungi to grow, which leads to infection. Most infections of the sinuses are caused by a virus. A sinus infection can develop quickly. It can last for up to 4 weeks (acute) or for more than 12 weeks (chronic). A sinus infection often develops after a cold. What are the causes? This condition is caused by anything that creates swelling in the sinuses or stops mucus from draining. This includes: Allergies. Asthma. Infection from bacteria or viruses. Deformities or blockages in your nose or sinuses. Abnormal growths in the nose (nasal polyps). Pollutants, such as chemicals or irritants in the air. Infection from fungi. This is rare. What increases the risk? You are more likely to develop this condition if you: Have a weak body defense system (immune system). Do a lot of swimming or diving. Overuse nasal sprays. Smoke. What are the  signs or symptoms? The main symptoms of this condition are pain and a feeling of pressure around the affected sinuses. Other symptoms include: Stuffy nose or congestion that makes it difficult to breathe through your nose. Thick yellow or greenish drainage from your nose. Tenderness, swelling, and warmth over the affected sinuses. A cough that may get worse at  night. Decreased sense of smell and taste. Extra mucus that collects in the throat or the back of the nose (postnasal drip) causing a sore throat or bad breath. Tiredness (fatigue). Fever. How is this diagnosed? This condition is diagnosed based on: Your symptoms. Your medical history. A physical exam. Tests to find out if your condition is acute or chronic. This may include: Checking your nose for nasal polyps. Viewing your sinuses using a device that has a Ow (endoscope). Testing for allergies or bacteria. Imaging tests, such as an MRI or CT scan. In rare cases, a bone biopsy may be done to rule out more serious types of fungal sinus disease. How is this treated? Treatment for a sinus infection depends on the cause and whether your condition is chronic or acute. If caused by a virus, your symptoms should go away on their own within 10 days. You may be given medicines to relieve symptoms. They include: Medicines that shrink swollen nasal passages (decongestants). A spray that eases inflammation of the nostrils (topical intranasal corticosteroids). Rinses that help get rid of thick mucus in your nose (nasal saline washes). Medicines that treat allergies (antihistamines). Over-the-counter pain relievers. If caused by bacteria, your health care provider may recommend waiting to see if your symptoms improve. Most bacterial infections will get better without antibiotic medicine. You may be given antibiotics if you have: A severe infection. A weak immune system. If caused by narrow nasal passages or nasal polyps, surgery may be needed. Follow these instructions at home: Medicines Take, use, or apply over-the-counter and prescription medicines only as told by your health care provider. These may include nasal sprays. If you were prescribed an antibiotic medicine, take it as told by your health care provider. Do not stop taking the antibiotic even if you start to feel better. Hydrate and  humidify  Drink enough fluid to keep your urine pale yellow. Staying hydrated will help to thin your mucus. Use a cool mist humidifier to keep the humidity level in your home above 50%. Inhale steam for 10-15 minutes, 3-4 times a day, or as told by your health care provider. You can do this in the bathroom while a hot shower is running. Limit your exposure to cool or dry air. Rest Rest as much as possible. Sleep with your head raised (elevated). Make sure you get enough sleep each night. General instructions  Apply a warm, moist washcloth to your face 3-4 times a day or as told by your health care provider. This will help with discomfort. Use nasal saline washes as often as told by your health care provider. Wash your hands often with soap and water to reduce your exposure to germs. If soap and water are not available, use hand sanitizer. Do not smoke. Avoid being around people who are smoking (secondhand smoke). Keep all follow-up visits. This is important. Contact a health care provider if: You have a fever. Your symptoms get worse. Your symptoms do not improve within 10 days. Get help right away if: You have a severe headache. You have persistent vomiting. You have severe pain or swelling around your face or eyes. You have vision  problems. You develop confusion. Your neck is stiff. You have trouble breathing. These symptoms may be an emergency. Get help right away. Call 911. Do not wait to see if the symptoms will go away. Do not drive yourself to the hospital. Summary A sinus infection is soreness and inflammation of your sinuses. Sinuses are hollow spaces in the bones around your face. This condition is caused by nasal tissues that become inflamed or swollen. The swelling traps or blocks the flow of mucus. This allows bacteria, viruses, and fungi to grow, which leads to infection. If you were prescribed an antibiotic medicine, take it as told by your health care provider. Do  not stop taking the antibiotic even if you start to feel better. Keep all follow-up visits. This is important. This information is not intended to replace advice given to you by your health care provider. Make sure you discuss any questions you have with your health care provider. Document Revised: 03/11/2021 Document Reviewed: 03/11/2021 Elsevier Patient Education  2024 [employer]   If you have been instructed to have an in-person evaluation today at a local Urgent Care facility, please use the link below. It will take you to a list of all of our available [facility], including address, phone number and hours of operation. Please do not delay care.  [facility]  If you or a family member do not have a primary care provider, use the link below to schedule a visit and establish care. When you choose a [facility] primary care physician or advanced practice provider, you gain a long-term partner in health. Find a Primary Care Provider  Learn more about [facility]'s in-office and virtual care options:  - Get Care Now

## 2024-03-10 NOTE — Progress Notes (Signed)
 Virtual Visit Consent   Tammy Gray, you are scheduled for a virtual visit with a [facility] provider today. Just as with appointments in the office, your consent must be obtained to participate. Your consent will be active for this visit and any virtual visit you may have with one of our providers in the next 365 days. If you have a MyChart account, a copy of this consent can be sent to you electronically.  As this is a virtual visit, video technology does not allow for your provider to perform a traditional examination. This may limit your provider's ability to fully assess your condition. If your provider identifies any concerns that need to be evaluated in person or the need to arrange testing (such as labs, EKG, etc.), we will make arrangements to do so. Although advances in technology are sophisticated, we cannot ensure that it will always work on either your end or our end. If the connection with a video visit is poor, the visit may have to be switched to a telephone visit. With either a video or telephone visit, we are not always able to ensure that we have a secure connection.  By engaging in this virtual visit, you consent to the provision of healthcare and authorize for your insurance to be billed (if applicable) for the services provided during this visit. Depending on your insurance coverage, you may receive a charge related to this service.  I need to obtain your verbal consent now. Are you willing to proceed with your visit today? Tammy Gray has provided verbal consent on 03/10/2024 for a virtual visit (video or telephone). Delon Tammy Dickinson, PA-C  Date: 03/10/2024 12:23 PM   Virtual Visit via Video Note   IDelon Tammy Gray, connected with  Tammy Gray  ([id number], Aug 10, 1965) on 03/10/24 at 12:15 PM EST by a video-enabled telemedicine application and verified that I am speaking with the correct person using two identifiers.  Location: Patient: Virtual Visit Location  Patient: Home Provider: Virtual Visit Location Provider: Home Office   I discussed the limitations of evaluation and management by telemedicine and the availability of in person appointments. The patient expressed understanding and agreed to proceed.    History of Present Illness: Tammy Gray is a 58 y.o. who identifies as a female who was assigned female at birth, and is being seen today for sinus congestion.  HPI: Sinusitis This is a new problem. The current episode started yesterday (Had similar symptoms a week ago, improved, then worsened yesterday). The problem has been gradually worsening since onset. Maximum temperature: low grade last night. Associated symptoms include chills, congestion, coughing (mild), headaches, a hoarse voice, sinus pressure and a sore throat. Pertinent negatives include no diaphoresis, ear pain or shortness of breath. (Body aches, ear fullness, mild nausea) Treatments tried: dayquil, saline rinses, flonase. The treatment provided no relief.     Problems:  Patient Active Problem List   Diagnosis Date Noted   Genetic testing 09/28/2016   Ductal carcinoma in situ (DCIS) of left breast 02/12/2016   Malignant neoplasm of upper-outer quadrant of left breast in female, estrogen receptor positive (HCC) 12/25/2015    Allergies: No Known Allergies Medications:  Current Outpatient Medications:    amoxicillin -clavulanate (AUGMENTIN ) 875-125 MG tablet, Take 1 tablet by mouth 2 (two) times daily., Disp: 14 tablet, Rfl: 0   benzonatate  (TESSALON ) 100 MG capsule, Take 1-2 capsules (100-200 mg total) by mouth 3 (three) times daily as needed., Disp: 30 capsule, Rfl: 0  fexofenadine  (ALLEGRA  ALLERGY) 180 MG tablet, Take 1 tablet (180 mg total) by mouth daily for 15 days., Disp: 15 tablet, Rfl: 0   hydrochlorothiazide  (HYDRODIURIL ) 12.5 MG tablet, Take 12.5 mg by mouth daily., Disp: , Rfl:    lisinopril  (PRINIVIL ,ZESTRIL ) 5 MG tablet, Take 5 mg by mouth daily., Disp: , Rfl:     Multiple Vitamin (MULTIVITAMIN WITH MINERALS) TABS tablet, Take 1 tablet by mouth at bedtime., Disp: , Rfl:    promethazine -dextromethorphan (PROMETHAZINE -DM) 6.25-15 MG/5ML syrup, Take 5 mLs by mouth 4 (four) times daily as needed., Disp: 118 mL, Rfl: 0   valACYclovir (VALTREX) 500 MG tablet, Take by mouth., Disp: , Rfl:   Observations/Objective: Patient is well-developed, well-nourished in no acute distress.  Resting comfortably at home.  Head is normocephalic, atraumatic.  No labored breathing.  Speech is clear and coherent with logical content.  Patient is alert and oriented at baseline.    Assessment and Plan: 1. Acute bacterial sinusitis (Primary) - amoxicillin -clavulanate (AUGMENTIN ) 875-125 MG tablet; Take 1 tablet by mouth 2 (two) times daily.  Dispense: 14 tablet; Refill: 0  - Worsening symptoms that have not responded to OTC medications.  - Will give Augmentin  - Continue allergy medications.  - Steam and humidifier can help - Stay well hydrated and get plenty of rest.  - Seek in person evaluation if no symptom improvement or if symptoms worsen   Follow Up Instructions: I discussed the assessment and treatment plan with the patient. The patient was provided an opportunity to ask questions and all were answered. The patient agreed with the plan and demonstrated an understanding of the instructions.  A copy of instructions were sent to the patient via MyChart unless otherwise noted below.    The patient was advised to call back or seek an in-person evaluation if the symptoms worsen or if the condition fails to improve as anticipated.    Delon Tammy Dickinson, PA-C

## 2024-04-28 ENCOUNTER — Other Ambulatory Visit: Payer: Self-pay | Admitting: Obstetrics & Gynecology

## 2024-04-28 DIAGNOSIS — Z1231 Encounter for screening mammogram for malignant neoplasm of breast: Secondary | ICD-10-CM

## 2024-05-25 ENCOUNTER — Ambulatory Visit

## 2024-06-01 ENCOUNTER — Ambulatory Visit
